# Patient Record
Sex: Female | Born: 1950 | Race: White | Hispanic: No | Marital: Single | Smoking: Never smoker
Health system: Southern US, Community
[De-identification: ages and names within clinical notes are randomized; demographics above are authoritative.]

## PROBLEM LIST (undated history)

## (undated) DIAGNOSIS — T7840XA Allergy, unspecified, initial encounter: Secondary | ICD-10-CM

## (undated) DIAGNOSIS — E559 Vitamin D deficiency, unspecified: Secondary | ICD-10-CM

## (undated) DIAGNOSIS — E785 Hyperlipidemia, unspecified: Secondary | ICD-10-CM

## (undated) DIAGNOSIS — C50919 Malignant neoplasm of unspecified site of unspecified female breast: Secondary | ICD-10-CM

## (undated) DIAGNOSIS — I10 Essential (primary) hypertension: Secondary | ICD-10-CM

## (undated) HISTORY — DX: Essential (primary) hypertension: I10

## (undated) HISTORY — DX: Vitamin D deficiency, unspecified: E55.9

## (undated) HISTORY — PX: TONSILLECTOMY: SUR1361

## (undated) HISTORY — DX: Malignant neoplasm of unspecified site of unspecified female breast: C50.919

## (undated) HISTORY — DX: Hyperlipidemia, unspecified: E78.5

## (undated) HISTORY — DX: Allergy, unspecified, initial encounter: T78.40XA

---

## 2001-04-23 HISTORY — PX: BIOPSY BREAST: PRO8

## 2002-01-05 ENCOUNTER — Other Ambulatory Visit: Admission: RE | Admit: 2002-01-05 | Discharge: 2002-01-05 | Payer: Self-pay | Admitting: Radiology

## 2004-03-14 ENCOUNTER — Other Ambulatory Visit: Admission: RE | Admit: 2004-03-14 | Discharge: 2004-03-14 | Payer: Self-pay | Admitting: Internal Medicine

## 2005-03-20 ENCOUNTER — Other Ambulatory Visit: Admission: RE | Admit: 2005-03-20 | Discharge: 2005-03-20 | Payer: Self-pay | Admitting: Internal Medicine

## 2006-01-10 DIAGNOSIS — C50919 Malignant neoplasm of unspecified site of unspecified female breast: Secondary | ICD-10-CM

## 2006-01-10 HISTORY — DX: Malignant neoplasm of unspecified site of unspecified female breast: C50.919

## 2006-01-15 ENCOUNTER — Ambulatory Visit: Payer: Self-pay | Admitting: Oncology

## 2006-01-17 ENCOUNTER — Ambulatory Visit (HOSPITAL_COMMUNITY): Admission: RE | Admit: 2006-01-17 | Discharge: 2006-01-17 | Payer: Self-pay | Admitting: Oncology

## 2006-01-17 ENCOUNTER — Encounter (INDEPENDENT_AMBULATORY_CARE_PROVIDER_SITE_OTHER): Payer: Self-pay | Admitting: *Deleted

## 2006-01-20 ENCOUNTER — Encounter: Admission: RE | Admit: 2006-01-20 | Discharge: 2006-01-20 | Payer: Self-pay | Admitting: Radiology

## 2006-01-22 ENCOUNTER — Ambulatory Visit (HOSPITAL_COMMUNITY): Admission: RE | Admit: 2006-01-22 | Discharge: 2006-01-22 | Payer: Self-pay | Admitting: Oncology

## 2006-02-01 ENCOUNTER — Ambulatory Visit (HOSPITAL_COMMUNITY): Admission: RE | Admit: 2006-02-01 | Discharge: 2006-02-01 | Payer: Self-pay | Admitting: Surgery

## 2006-02-27 ENCOUNTER — Ambulatory Visit: Payer: Self-pay | Admitting: Oncology

## 2006-02-27 LAB — COMPREHENSIVE METABOLIC PANEL
Albumin: 4.4 g/dL (ref 3.5–5.2)
BUN: 14 mg/dL (ref 6–23)
CO2: 26 mEq/L (ref 19–32)
Glucose, Bld: 118 mg/dL — ABNORMAL HIGH (ref 70–99)
Potassium: 4.1 mEq/L (ref 3.5–5.3)
Sodium: 140 mEq/L (ref 135–145)
Total Protein: 7.2 g/dL (ref 6.0–8.3)

## 2006-02-27 LAB — CBC WITH DIFFERENTIAL/PLATELET
Basophils Absolute: 0 10*3/uL (ref 0.0–0.1)
Eosinophils Absolute: 0 10*3/uL (ref 0.0–0.5)
HGB: 12.2 g/dL (ref 11.6–15.9)
LYMPH%: 9.6 % — ABNORMAL LOW (ref 14.0–48.0)
MCV: 91.1 fL (ref 81.0–101.0)
MONO#: 0.1 10*3/uL (ref 0.1–0.9)
MONO%: 1.5 % (ref 0.0–13.0)
NEUT#: 6.7 10*3/uL — ABNORMAL HIGH (ref 1.5–6.5)
Platelets: 471 10*3/uL — ABNORMAL HIGH (ref 145–400)
RBC: 3.83 10*6/uL (ref 3.70–5.32)
WBC: 7.5 10*3/uL (ref 3.9–10.0)

## 2006-03-07 LAB — CBC WITH DIFFERENTIAL/PLATELET
Basophils Absolute: 0.1 10*3/uL (ref 0.0–0.1)
Eosinophils Absolute: 0 10*3/uL (ref 0.0–0.5)
HGB: 11.9 g/dL (ref 11.6–15.9)
MONO#: 2.3 10*3/uL — ABNORMAL HIGH (ref 0.1–0.9)
NEUT#: 26 10*3/uL — ABNORMAL HIGH (ref 1.5–6.5)
RBC: 3.75 10*6/uL (ref 3.70–5.32)
RDW: 14.5 % (ref 11.3–14.5)
WBC: 32.6 10*3/uL — ABNORMAL HIGH (ref 3.9–10.0)
lymph#: 4.2 10*3/uL — ABNORMAL HIGH (ref 0.9–3.3)

## 2006-03-20 LAB — CBC WITH DIFFERENTIAL/PLATELET
Basophils Absolute: 0.1 10*3/uL (ref 0.0–0.1)
Eosinophils Absolute: 0 10*3/uL (ref 0.0–0.5)
HCT: 32.9 % — ABNORMAL LOW (ref 34.8–46.6)
HGB: 11.4 g/dL — ABNORMAL LOW (ref 11.6–15.9)
LYMPH%: 21.1 % (ref 14.0–48.0)
MONO#: 0.6 10*3/uL (ref 0.1–0.9)
NEUT#: 4.1 10*3/uL (ref 1.5–6.5)
NEUT%: 67.4 % (ref 39.6–76.8)
Platelets: 303 10*3/uL (ref 145–400)
RBC: 3.64 10*6/uL — ABNORMAL LOW (ref 3.70–5.32)
WBC: 6.1 10*3/uL (ref 3.9–10.0)

## 2006-03-20 LAB — COMPREHENSIVE METABOLIC PANEL
Albumin: 4 g/dL (ref 3.5–5.2)
CO2: 27 mEq/L (ref 19–32)
Glucose, Bld: 104 mg/dL — ABNORMAL HIGH (ref 70–99)
Sodium: 141 mEq/L (ref 135–145)
Total Bilirubin: 0.6 mg/dL (ref 0.3–1.2)
Total Protein: 6.7 g/dL (ref 6.0–8.3)

## 2006-03-20 LAB — RESEARCH LABS

## 2006-03-27 LAB — CBC WITH DIFFERENTIAL/PLATELET
BASO%: 0.7 % (ref 0.0–2.0)
Basophils Absolute: 0.1 10*3/uL (ref 0.0–0.1)
EOS%: 0.2 % (ref 0.0–7.0)
HCT: 33.7 % — ABNORMAL LOW (ref 34.8–46.6)
HGB: 11.6 g/dL (ref 11.6–15.9)
MCH: 31.9 pg (ref 26.0–34.0)
MCHC: 34.5 g/dL (ref 32.0–36.0)
MONO#: 0.2 10*3/uL (ref 0.1–0.9)
RDW: 15.2 % — ABNORMAL HIGH (ref 11.3–14.5)
WBC: 10.4 10*3/uL — ABNORMAL HIGH (ref 3.9–10.0)
lymph#: 2.7 10*3/uL (ref 0.9–3.3)

## 2006-04-10 ENCOUNTER — Ambulatory Visit: Payer: Self-pay | Admitting: Oncology

## 2006-04-10 LAB — CBC WITH DIFFERENTIAL/PLATELET
Basophils Absolute: 0 10*3/uL (ref 0.0–0.1)
Eosinophils Absolute: 0 10*3/uL (ref 0.0–0.5)
HCT: 31.8 % — ABNORMAL LOW (ref 34.8–46.6)
HGB: 10.6 g/dL — ABNORMAL LOW (ref 11.6–15.9)
NEUT#: 4.8 10*3/uL (ref 1.5–6.5)
NEUT%: 81.8 % — ABNORMAL HIGH (ref 39.6–76.8)
RDW: 16.6 % — ABNORMAL HIGH (ref 11.3–14.5)
lymph#: 0.8 10*3/uL — ABNORMAL LOW (ref 0.9–3.3)

## 2006-04-10 LAB — COMPREHENSIVE METABOLIC PANEL
Albumin: 4 g/dL (ref 3.5–5.2)
BUN: 17 mg/dL (ref 6–23)
CO2: 27 mEq/L (ref 19–32)
Calcium: 9.1 mg/dL (ref 8.4–10.5)
Chloride: 104 mEq/L (ref 96–112)
Glucose, Bld: 106 mg/dL — ABNORMAL HIGH (ref 70–99)
Potassium: 3.9 mEq/L (ref 3.5–5.3)

## 2006-04-29 ENCOUNTER — Encounter: Admission: RE | Admit: 2006-04-29 | Discharge: 2006-04-29 | Payer: Self-pay | Admitting: Oncology

## 2006-05-01 LAB — CBC WITH DIFFERENTIAL/PLATELET
Eosinophils Absolute: 0 10*3/uL (ref 0.0–0.5)
MONO#: 0.5 10*3/uL (ref 0.1–0.9)
NEUT#: 3.7 10*3/uL (ref 1.5–6.5)
Platelets: 341 10*3/uL (ref 145–400)
RBC: 3.57 10*6/uL — ABNORMAL LOW (ref 3.70–5.32)
RDW: 18 % — ABNORMAL HIGH (ref 11.3–14.5)
WBC: 5.7 10*3/uL (ref 3.9–10.0)
lymph#: 1.3 10*3/uL (ref 0.9–3.3)

## 2006-05-01 LAB — COMPREHENSIVE METABOLIC PANEL
Albumin: 4.1 g/dL (ref 3.5–5.2)
CO2: 26 mEq/L (ref 19–32)
Chloride: 104 mEq/L (ref 96–112)
Glucose, Bld: 102 mg/dL — ABNORMAL HIGH (ref 70–99)
Potassium: 4 mEq/L (ref 3.5–5.3)
Sodium: 139 mEq/L (ref 135–145)
Total Protein: 6.5 g/dL (ref 6.0–8.3)

## 2006-05-08 LAB — CBC WITH DIFFERENTIAL/PLATELET
BASO%: 0.4 % (ref 0.0–2.0)
Eosinophils Absolute: 0 10*3/uL (ref 0.0–0.5)
MCHC: 34.2 g/dL (ref 32.0–36.0)
MONO#: 0.1 10*3/uL (ref 0.1–0.9)
MONO%: 1.7 % (ref 0.0–13.0)
NEUT#: 2.4 10*3/uL (ref 1.5–6.5)
RBC: 3.28 10*6/uL — ABNORMAL LOW (ref 3.70–5.32)
RDW: 16.6 % — ABNORMAL HIGH (ref 11.3–14.5)
WBC: 3.2 10*3/uL — ABNORMAL LOW (ref 3.9–10.0)

## 2006-05-20 ENCOUNTER — Ambulatory Visit: Payer: Self-pay | Admitting: Oncology

## 2006-05-22 LAB — COMPREHENSIVE METABOLIC PANEL
ALT: <8 U/L (ref 0–35)
Albumin: 4.2 g/dL (ref 3.5–5.2)
CO2: 27 mEq/L (ref 19–32)
Calcium: 9.2 mg/dL (ref 8.4–10.5)
Chloride: 104 mEq/L (ref 96–112)
Glucose, Bld: 118 mg/dL — ABNORMAL HIGH (ref 70–99)
Potassium: 4.1 mEq/L (ref 3.5–5.3)
Sodium: 140 mEq/L (ref 135–145)
Total Bilirubin: 0.6 mg/dL (ref 0.3–1.2)
Total Protein: 6.5 g/dL (ref 6.0–8.3)

## 2006-05-22 LAB — CBC WITH DIFFERENTIAL/PLATELET
BASO%: 0.7 % (ref 0.0–2.0)
Eosinophils Absolute: 0 10*3/uL (ref 0.0–0.5)
HCT: 32.3 % — ABNORMAL LOW (ref 34.8–46.6)
LYMPH%: 18.4 % (ref 14.0–48.0)
MONO#: 0.6 10*3/uL (ref 0.1–0.9)
NEUT#: 3.4 10*3/uL (ref 1.5–6.5)
Platelets: 389 10*3/uL (ref 145–400)
RBC: 3.39 10*6/uL — ABNORMAL LOW (ref 3.70–5.32)
WBC: 5 10*3/uL (ref 3.9–10.0)
lymph#: 0.9 10*3/uL (ref 0.9–3.3)

## 2006-06-12 LAB — COMPREHENSIVE METABOLIC PANEL
ALT: 8 U/L (ref 0–35)
AST: 15 U/L (ref 0–37)
BUN: 16 mg/dL (ref 6–23)
Creatinine, Ser: 0.69 mg/dL (ref 0.40–1.20)
Total Bilirubin: 0.4 mg/dL (ref 0.3–1.2)

## 2006-06-12 LAB — CBC WITH DIFFERENTIAL/PLATELET
BASO%: 0.6 % (ref 0.0–2.0)
Basophils Absolute: 0 10*3/uL (ref 0.0–0.1)
EOS%: 0 % (ref 0.0–7.0)
HCT: 31.2 % — ABNORMAL LOW (ref 34.8–46.6)
LYMPH%: 15.6 % (ref 14.0–48.0)
MCH: 33.1 pg (ref 26.0–34.0)
MCHC: 35.2 g/dL (ref 32.0–36.0)
MCV: 94 fL (ref 81.0–101.0)
NEUT%: 72.9 % (ref 39.6–76.8)
Platelets: 347 10*3/uL (ref 145–400)

## 2006-06-19 LAB — CBC WITH DIFFERENTIAL/PLATELET
BASO%: 0.1 % (ref 0.0–2.0)
Basophils Absolute: 0 10*3/uL (ref 0.0–0.1)
HCT: 29.8 % — ABNORMAL LOW (ref 34.8–46.6)
HGB: 10.4 g/dL — ABNORMAL LOW (ref 11.6–15.9)
LYMPH%: 12.7 % — ABNORMAL LOW (ref 14.0–48.0)
MCH: 32.8 pg (ref 26.0–34.0)
MCHC: 35 g/dL (ref 32.0–36.0)
MONO#: 0.1 10*3/uL (ref 0.1–0.9)
NEUT%: 85.7 % — ABNORMAL HIGH (ref 39.6–76.8)
Platelets: 176 10*3/uL (ref 145–400)
WBC: 3.9 10*3/uL (ref 3.9–10.0)
lymph#: 0.5 10*3/uL — ABNORMAL LOW (ref 0.9–3.3)

## 2006-07-02 ENCOUNTER — Ambulatory Visit: Payer: Self-pay | Admitting: Oncology

## 2006-07-03 LAB — COMPREHENSIVE METABOLIC PANEL
ALT: <8 U/L (ref 0–35)
AST: 14 U/L (ref 0–37)
Alkaline Phosphatase: 102 U/L (ref 39–117)
BUN: 18 mg/dL (ref 6–23)
Calcium: 9.4 mg/dL (ref 8.4–10.5)
Creatinine, Ser: 0.72 mg/dL (ref 0.40–1.20)
Total Bilirubin: 0.4 mg/dL (ref 0.3–1.2)

## 2006-07-03 LAB — CBC WITH DIFFERENTIAL/PLATELET
BASO%: 1.7 % (ref 0.0–2.0)
Basophils Absolute: 0.1 10*3/uL (ref 0.0–0.1)
EOS%: 0 % (ref 0.0–7.0)
HCT: 31.9 % — ABNORMAL LOW (ref 34.8–46.6)
HGB: 11.2 g/dL — ABNORMAL LOW (ref 11.6–15.9)
LYMPH%: 17.1 % (ref 14.0–48.0)
MCH: 33.1 pg (ref 26.0–34.0)
MCHC: 35 g/dL (ref 32.0–36.0)
MCV: 94.5 fL (ref 81.0–101.0)
MONO%: 13.5 % — ABNORMAL HIGH (ref 0.0–13.0)
NEUT%: 67.7 % (ref 39.6–76.8)
lymph#: 1 10*3/uL (ref 0.9–3.3)

## 2006-07-25 ENCOUNTER — Ambulatory Visit: Payer: Self-pay

## 2006-07-25 ENCOUNTER — Encounter: Payer: Self-pay | Admitting: Internal Medicine

## 2006-07-29 LAB — COMPREHENSIVE METABOLIC PANEL
ALT: 8 U/L (ref 0–35)
AST: 21 U/L (ref 0–37)
Albumin: 4.3 g/dL (ref 3.5–5.2)
Alkaline Phosphatase: 86 U/L (ref 39–117)
BUN: 16 mg/dL (ref 6–23)
CO2: 24 mEq/L (ref 19–32)
Calcium: 9.3 mg/dL (ref 8.4–10.5)
Chloride: 106 mEq/L (ref 96–112)
Creatinine, Ser: 0.69 mg/dL (ref 0.40–1.20)
Glucose, Bld: 102 mg/dL — ABNORMAL HIGH (ref 70–99)
Potassium: 3.8 mEq/L (ref 3.5–5.3)
Sodium: 142 mEq/L (ref 135–145)
Total Bilirubin: 0.5 mg/dL (ref 0.3–1.2)
Total Protein: 6.6 g/dL (ref 6.0–8.3)

## 2006-07-29 LAB — CBC WITH DIFFERENTIAL/PLATELET
BASO%: 0.9 % (ref 0.0–2.0)
Eosinophils Absolute: 0 10*3/uL (ref 0.0–0.5)
MCHC: 35.3 g/dL (ref 32.0–36.0)
MONO#: 0.5 10*3/uL (ref 0.1–0.9)
MONO%: 14.9 % — ABNORMAL HIGH (ref 0.0–13.0)
NEUT#: 2 10*3/uL (ref 1.5–6.5)
RBC: 3.24 10*6/uL — ABNORMAL LOW (ref 3.70–5.32)
RDW: 17.2 % — ABNORMAL HIGH (ref 11.3–14.5)
WBC: 3.2 10*3/uL — ABNORMAL LOW (ref 3.9–10.0)

## 2006-07-29 LAB — RESEARCH LABS

## 2006-08-06 ENCOUNTER — Ambulatory Visit (HOSPITAL_COMMUNITY): Admission: RE | Admit: 2006-08-06 | Discharge: 2006-08-06 | Payer: Self-pay | Admitting: Oncology

## 2006-08-12 ENCOUNTER — Encounter (INDEPENDENT_AMBULATORY_CARE_PROVIDER_SITE_OTHER): Payer: Self-pay | Admitting: *Deleted

## 2006-08-12 ENCOUNTER — Ambulatory Visit (HOSPITAL_BASED_OUTPATIENT_CLINIC_OR_DEPARTMENT_OTHER): Admission: RE | Admit: 2006-08-12 | Discharge: 2006-08-13 | Payer: Self-pay | Admitting: Surgery

## 2006-08-12 HISTORY — PX: BREAST LUMPECTOMY W/ NEEDLE LOCALIZATION: SHX1266

## 2006-08-27 ENCOUNTER — Ambulatory Visit: Admission: RE | Admit: 2006-08-27 | Discharge: 2006-10-29 | Payer: Self-pay | Admitting: Radiation Oncology

## 2006-08-30 ENCOUNTER — Ambulatory Visit: Payer: Self-pay | Admitting: Oncology

## 2006-09-02 LAB — CBC WITH DIFFERENTIAL/PLATELET
BASO%: 0.5 % (ref 0.0–2.0)
EOS%: 1.1 % (ref 0.0–7.0)
HCT: 33.2 % — ABNORMAL LOW (ref 34.8–46.6)
LYMPH%: 20.7 % (ref 14.0–48.0)
MCH: 33.3 pg (ref 26.0–34.0)
MCHC: 36.1 g/dL — ABNORMAL HIGH (ref 32.0–36.0)
MCV: 92.4 fL (ref 81.0–101.0)
MONO#: 0.5 10*3/uL (ref 0.1–0.9)
MONO%: 8.6 % (ref 0.0–13.0)
NEUT%: 69.1 % (ref 39.6–76.8)
Platelets: 280 10*3/uL (ref 145–400)
RBC: 3.59 10*6/uL — ABNORMAL LOW (ref 3.70–5.32)
WBC: 6 10*3/uL (ref 3.9–10.0)

## 2006-09-02 LAB — COMPREHENSIVE METABOLIC PANEL
ALT: <8 U/L (ref 0–35)
AST: 15 U/L (ref 0–37)
Alkaline Phosphatase: 104 U/L (ref 39–117)
CO2: 28 mEq/L (ref 19–32)
Creatinine, Ser: 0.8 mg/dL (ref 0.40–1.20)
Sodium: 140 mEq/L (ref 135–145)
Total Bilirubin: 0.5 mg/dL (ref 0.3–1.2)
Total Protein: 6.7 g/dL (ref 6.0–8.3)

## 2006-09-02 LAB — LACTATE DEHYDROGENASE: LDH: 170 U/L (ref 94–250)

## 2006-10-15 ENCOUNTER — Other Ambulatory Visit: Admission: RE | Admit: 2006-10-15 | Discharge: 2006-10-15 | Payer: Self-pay | Admitting: Family Medicine

## 2006-10-24 ENCOUNTER — Ambulatory Visit: Payer: Self-pay | Admitting: Oncology

## 2006-10-28 LAB — CBC WITH DIFFERENTIAL/PLATELET
BASO%: 0.6 % (ref 0.0–2.0)
Basophils Absolute: 0 10*3/uL (ref 0.0–0.1)
EOS%: 2.3 % (ref 0.0–7.0)
HGB: 12.9 g/dL (ref 11.6–15.9)
MCH: 31.6 pg (ref 26.0–34.0)
MCHC: 35.1 g/dL (ref 32.0–36.0)
MCV: 90 fL (ref 81.0–101.0)
MONO%: 11.9 % (ref 0.0–13.0)
RDW: 13.3 % (ref 11.3–14.5)

## 2006-10-28 LAB — COMPREHENSIVE METABOLIC PANEL
AST: 20 U/L (ref 0–37)
Albumin: 4.2 g/dL (ref 3.5–5.2)
Alkaline Phosphatase: 87 U/L (ref 39–117)
BUN: 16 mg/dL (ref 6–23)
Creatinine, Ser: 0.66 mg/dL (ref 0.40–1.20)
Potassium: 4.4 mEq/L (ref 3.5–5.3)

## 2006-10-31 ENCOUNTER — Encounter: Admission: RE | Admit: 2006-10-31 | Discharge: 2006-11-25 | Payer: Self-pay | Admitting: Radiation Oncology

## 2006-11-25 ENCOUNTER — Ambulatory Visit (HOSPITAL_BASED_OUTPATIENT_CLINIC_OR_DEPARTMENT_OTHER): Admission: RE | Admit: 2006-11-25 | Discharge: 2006-11-25 | Payer: Self-pay | Admitting: Surgery

## 2007-02-13 ENCOUNTER — Ambulatory Visit: Payer: Self-pay | Admitting: Oncology

## 2007-05-14 ENCOUNTER — Ambulatory Visit: Payer: Self-pay | Admitting: Oncology

## 2007-05-16 LAB — COMPREHENSIVE METABOLIC PANEL
AST: 17 U/L (ref 0–37)
Albumin: 4.2 g/dL (ref 3.5–5.2)
BUN: 18 mg/dL (ref 6–23)
CO2: 26 mEq/L (ref 19–32)
Calcium: 9.2 mg/dL (ref 8.4–10.5)
Chloride: 104 mEq/L (ref 96–112)
Glucose, Bld: 113 mg/dL — ABNORMAL HIGH (ref 70–99)
Potassium: 3.8 mEq/L (ref 3.5–5.3)

## 2007-05-16 LAB — CBC WITH DIFFERENTIAL/PLATELET
Eosinophils Absolute: 0.1 10*3/uL (ref 0.0–0.5)
HCT: 37.4 % (ref 34.8–46.6)
LYMPH%: 30.5 % (ref 14.0–48.0)
MONO#: 0.4 10*3/uL (ref 0.1–0.9)
NEUT#: 2.7 10*3/uL (ref 1.5–6.5)
NEUT%: 57.6 % (ref 39.6–76.8)
Platelets: 242 10*3/uL (ref 145–400)
WBC: 4.7 10*3/uL (ref 3.9–10.0)

## 2007-05-16 LAB — LACTATE DEHYDROGENASE: LDH: 155 U/L (ref 94–250)

## 2007-06-25 ENCOUNTER — Ambulatory Visit: Payer: Self-pay | Admitting: Oncology

## 2007-07-23 IMAGING — CR DG CHEST 2V
2 series · 2 of 2 positions shown · non-contrast
Comparison: Comparison is made with CT of 01/22/06.

CLINICAL DATA: Right breast cancer.  Pre-admit.
 CHEST - 2 VIEW:

[view not recorded (1 of 2)]
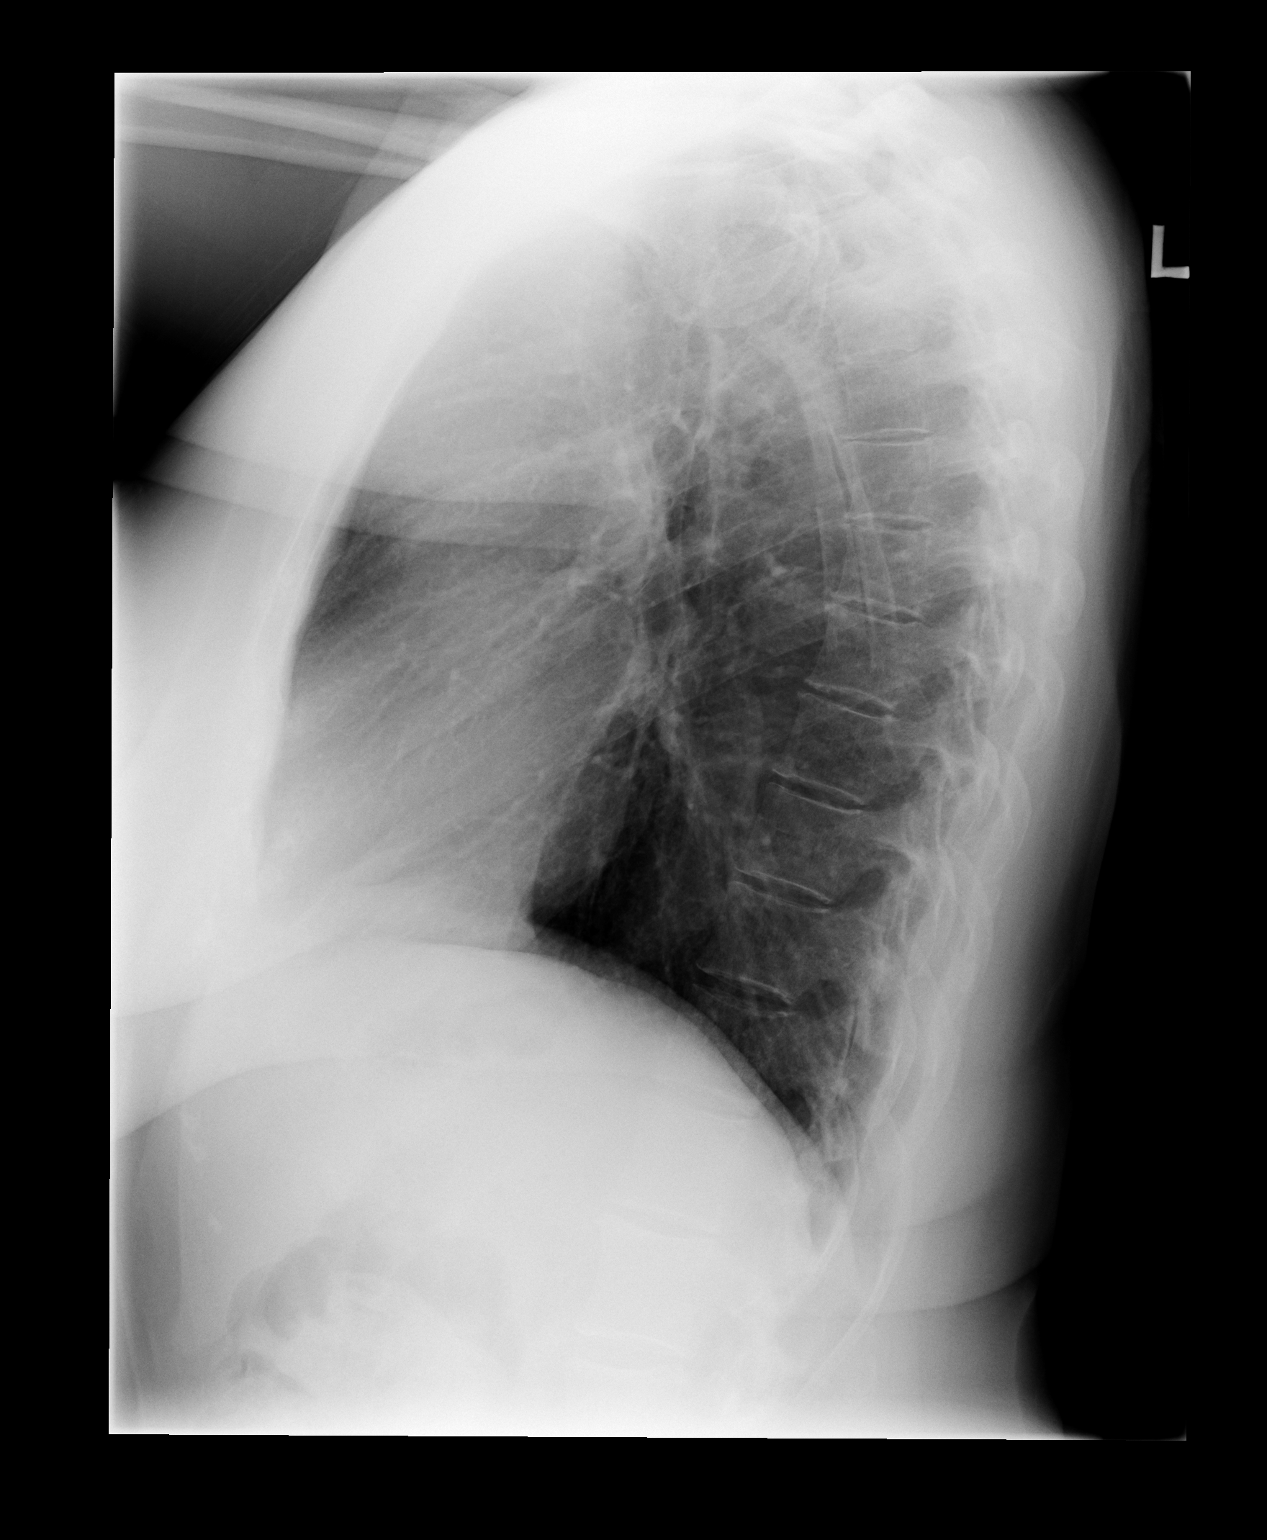

[view not recorded (2 of 2)]
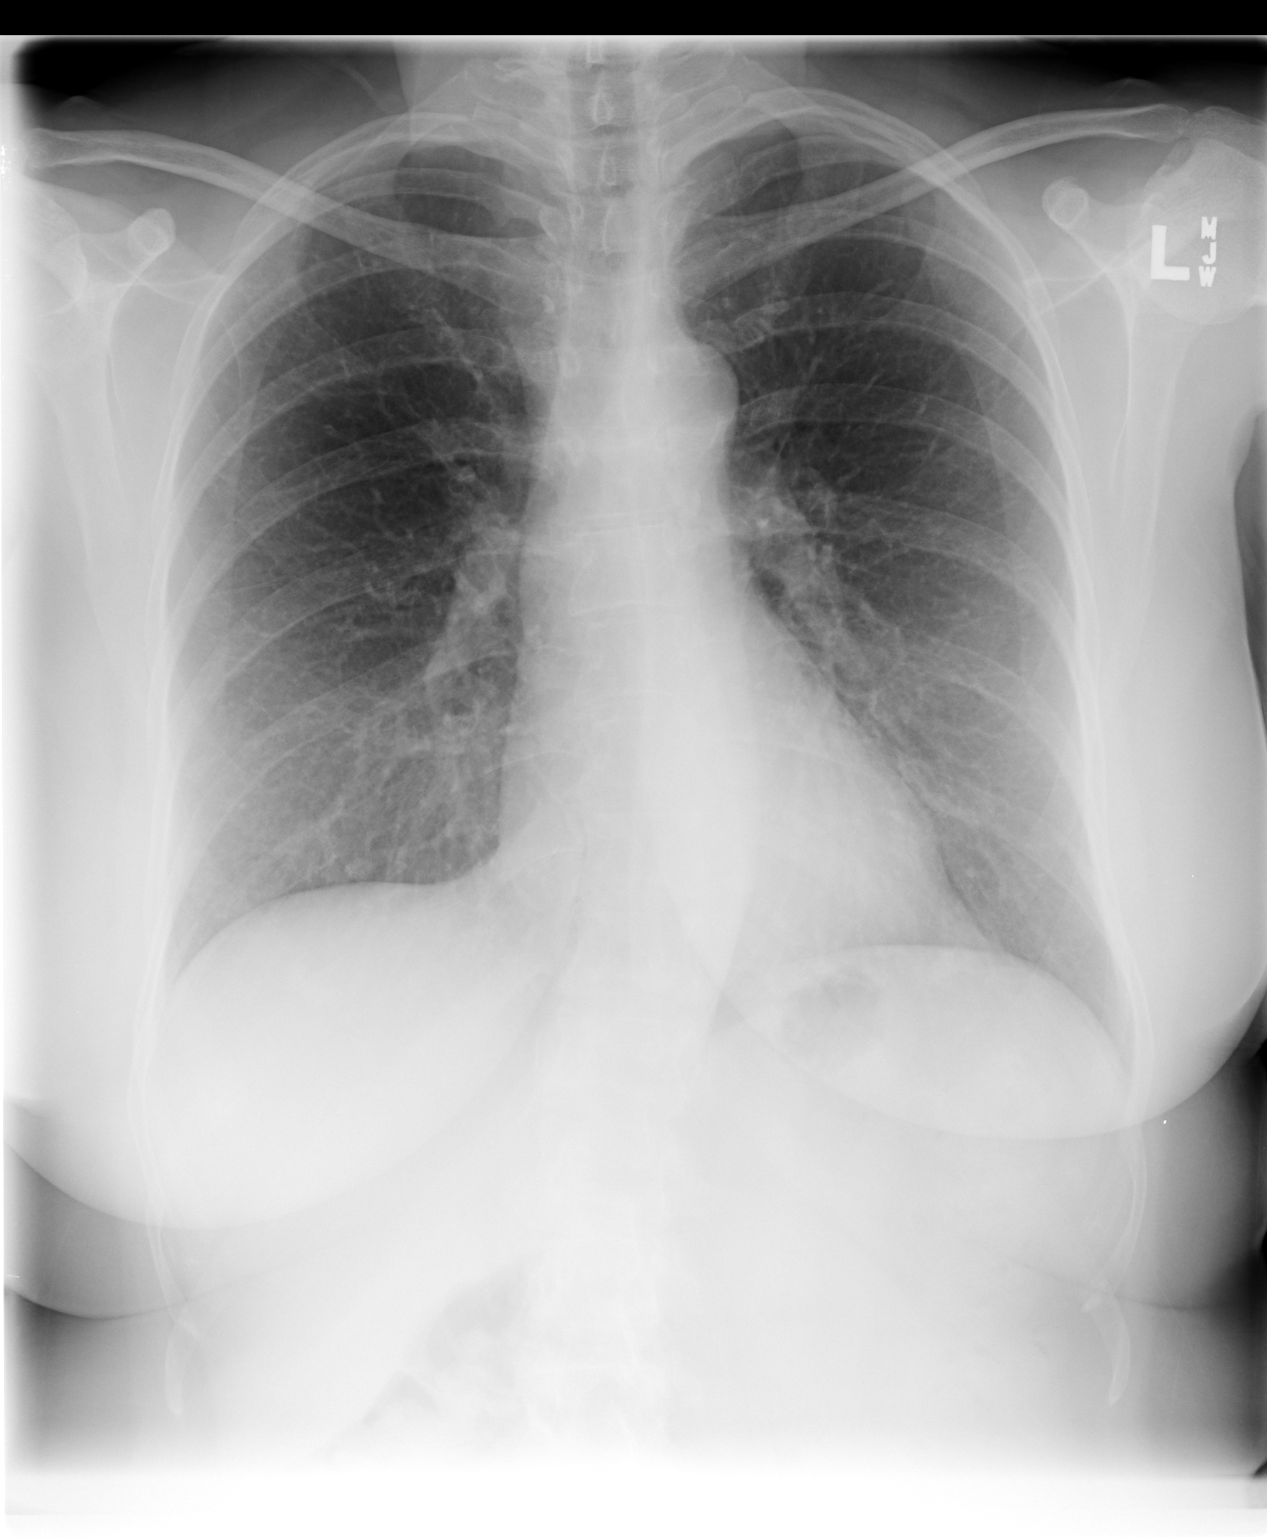

[2 of 2 positions shown; findings below may reference images not displayed]

FINDINGS: Midline trachea.  Normal heart size.  The costophrenic angles are sharp.  The lungs are clear.
IMPRESSION: No acute cardiopulmonary disease.

## 2007-08-06 ENCOUNTER — Ambulatory Visit: Payer: Self-pay | Admitting: Oncology

## 2007-08-11 LAB — CBC WITH DIFFERENTIAL/PLATELET
BASO%: 0.7 % (ref 0.0–2.0)
Basophils Absolute: 0 10*3/uL (ref 0.0–0.1)
HCT: 36.7 % (ref 34.8–46.6)
HGB: 13 g/dL (ref 11.6–15.9)
MCHC: 35.4 g/dL (ref 32.0–36.0)
MONO#: 0.5 10*3/uL (ref 0.1–0.9)
NEUT%: 61.5 % (ref 39.6–76.8)
RDW: 13 % (ref 11.3–14.5)
WBC: 5.7 10*3/uL (ref 3.9–10.0)
lymph#: 1.7 10*3/uL (ref 0.9–3.3)

## 2007-08-11 LAB — COMPREHENSIVE METABOLIC PANEL
Alkaline Phosphatase: 78 U/L (ref 39–117)
BUN: 18 mg/dL (ref 6–23)
CO2: 27 mEq/L (ref 19–32)
Glucose, Bld: 123 mg/dL — ABNORMAL HIGH (ref 70–99)
Sodium: 139 mEq/L (ref 135–145)
Total Bilirubin: 0.9 mg/dL (ref 0.3–1.2)
Total Protein: 7 g/dL (ref 6.0–8.3)

## 2007-08-11 LAB — LACTATE DEHYDROGENASE: LDH: 172 U/L (ref 94–250)

## 2007-08-11 LAB — CANCER ANTIGEN 27.29: CA 27.29: 12 U/mL (ref 0–39)

## 2007-10-16 ENCOUNTER — Other Ambulatory Visit: Admission: RE | Admit: 2007-10-16 | Discharge: 2007-10-16 | Payer: Self-pay | Admitting: Family Medicine

## 2008-02-10 ENCOUNTER — Ambulatory Visit: Payer: Self-pay | Admitting: Oncology

## 2008-02-12 LAB — CBC WITH DIFFERENTIAL/PLATELET
Eosinophils Absolute: 0.1 10*3/uL (ref 0.0–0.5)
HCT: 37.8 % (ref 34.8–46.6)
LYMPH%: 33.2 % (ref 14.0–48.0)
MONO#: 0.4 10*3/uL (ref 0.1–0.9)
NEUT#: 3.1 10*3/uL (ref 1.5–6.5)
Platelets: 238 10*3/uL (ref 145–400)
RBC: 4.1 10*6/uL (ref 3.70–5.32)
WBC: 5.5 10*3/uL (ref 3.9–10.0)

## 2008-02-13 LAB — COMPREHENSIVE METABOLIC PANEL
AST: 17 U/L (ref 0–37)
Alkaline Phosphatase: 84 U/L (ref 39–117)
BUN: 15 mg/dL (ref 6–23)
Creatinine, Ser: 0.93 mg/dL (ref 0.40–1.20)

## 2008-02-13 LAB — VITAMIN D 25 HYDROXY (VIT D DEFICIENCY, FRACTURES): Vit D, 25-Hydroxy: 35 ng/mL (ref 30–89)

## 2008-08-25 ENCOUNTER — Ambulatory Visit: Payer: Self-pay | Admitting: Oncology

## 2008-08-27 LAB — CBC WITH DIFFERENTIAL/PLATELET
BASO%: 0.8 % (ref 0.0–2.0)
EOS%: 1.9 % (ref 0.0–7.0)
HCT: 37.9 % (ref 34.8–46.6)
LYMPH%: 36.3 % (ref 14.0–49.7)
MCH: 31.7 pg (ref 25.1–34.0)
MCHC: 34.4 g/dL (ref 31.5–36.0)
MONO%: 6.5 % (ref 0.0–14.0)
NEUT%: 54.5 % (ref 38.4–76.8)
Platelets: 222 10*3/uL (ref 145–400)

## 2008-08-30 LAB — COMPREHENSIVE METABOLIC PANEL
AST: 19 U/L (ref 0–37)
Alkaline Phosphatase: 92 U/L (ref 39–117)
BUN: 15 mg/dL (ref 6–23)
Calcium: 9.6 mg/dL (ref 8.4–10.5)
Creatinine, Ser: 0.8 mg/dL (ref 0.40–1.20)
Total Bilirubin: 0.7 mg/dL (ref 0.3–1.2)

## 2008-08-30 LAB — VITAMIN D 25 HYDROXY (VIT D DEFICIENCY, FRACTURES): Vit D, 25-Hydroxy: 48 ng/mL (ref 30–89)

## 2008-11-11 ENCOUNTER — Other Ambulatory Visit: Admission: RE | Admit: 2008-11-11 | Discharge: 2008-11-11 | Payer: Self-pay | Admitting: Family Medicine

## 2009-02-04 ENCOUNTER — Ambulatory Visit: Payer: Self-pay | Admitting: Oncology

## 2009-02-08 LAB — CBC WITH DIFFERENTIAL/PLATELET
BASO%: 0.6 % (ref 0.0–2.0)
Eosinophils Absolute: 0.1 10*3/uL (ref 0.0–0.5)
HCT: 37.9 % (ref 34.8–46.6)
MCHC: 35.4 g/dL (ref 31.5–36.0)
MONO#: 0.4 10*3/uL (ref 0.1–0.9)
NEUT#: 3 10*3/uL (ref 1.5–6.5)
NEUT%: 53.5 % (ref 38.4–76.8)
WBC: 5.6 10*3/uL (ref 3.9–10.3)
lymph#: 2.1 10*3/uL (ref 0.9–3.3)

## 2009-02-09 LAB — COMPREHENSIVE METABOLIC PANEL
ALT: <8 U/L (ref 0–35)
CO2: 27 mEq/L (ref 19–32)
Calcium: 9.6 mg/dL (ref 8.4–10.5)
Chloride: 103 mEq/L (ref 96–112)
Creatinine, Ser: 0.82 mg/dL (ref 0.40–1.20)
Sodium: 139 mEq/L (ref 135–145)
Total Protein: 6.9 g/dL (ref 6.0–8.3)

## 2009-02-09 LAB — LACTATE DEHYDROGENASE: LDH: 186 U/L (ref 94–250)

## 2009-08-08 ENCOUNTER — Ambulatory Visit: Payer: Self-pay | Admitting: Oncology

## 2009-08-10 LAB — COMPREHENSIVE METABOLIC PANEL WITH GFR
ALT: 10 U/L (ref 0–35)
AST: 24 U/L (ref 0–37)
Albumin: 4 g/dL (ref 3.5–5.2)
Alkaline Phosphatase: 97 U/L (ref 39–117)
BUN: 17 mg/dL (ref 6–23)
CO2: 31 meq/L (ref 19–32)
Calcium: 9.7 mg/dL (ref 8.4–10.5)
Chloride: 101 meq/L (ref 96–112)
Creatinine, Ser: 0.69 mg/dL (ref 0.40–1.20)
Glucose, Bld: 122 mg/dL — ABNORMAL HIGH (ref 70–99)
Potassium: 3.8 meq/L (ref 3.5–5.3)
Sodium: 139 meq/L (ref 135–145)
Total Bilirubin: 0.8 mg/dL (ref 0.3–1.2)
Total Protein: 7.1 g/dL (ref 6.0–8.3)

## 2009-08-10 LAB — CBC WITH DIFFERENTIAL/PLATELET
Basophils Absolute: 0 10*3/uL (ref 0.0–0.1)
Eosinophils Absolute: 0.2 10*3/uL (ref 0.0–0.5)
HGB: 13.6 g/dL (ref 11.6–15.9)
MONO#: 0.5 10*3/uL (ref 0.1–0.9)
NEUT#: 4.4 10*3/uL (ref 1.5–6.5)
RDW: 13.7 % (ref 11.2–14.5)
lymph#: 2.6 10*3/uL (ref 0.9–3.3)

## 2009-08-11 LAB — VITAMIN D 25 HYDROXY (VIT D DEFICIENCY, FRACTURES): Vit D, 25-Hydroxy: 64 ng/mL (ref 30–89)

## 2009-08-11 LAB — CANCER ANTIGEN 27.29: CA 27.29: 9 U/mL (ref 0–39)

## 2010-02-14 ENCOUNTER — Ambulatory Visit: Payer: Self-pay | Admitting: Oncology

## 2010-02-16 LAB — CBC WITH DIFFERENTIAL/PLATELET
Eosinophils Absolute: 0.1 10*3/uL (ref 0.0–0.5)
LYMPH%: 37.2 % (ref 14.0–49.7)
MCH: 32.7 pg (ref 25.1–34.0)
MCHC: 35.1 g/dL (ref 31.5–36.0)
MCV: 93.2 fL (ref 79.5–101.0)
MONO%: 8.4 % (ref 0.0–14.0)
NEUT#: 2.8 10*3/uL (ref 1.5–6.5)
Platelets: 248 10*3/uL (ref 145–400)
RBC: 3.98 10*6/uL (ref 3.70–5.45)

## 2010-02-17 LAB — CANCER ANTIGEN 27.29: CA 27.29: 15 U/mL (ref 0–39)

## 2010-02-17 LAB — VITAMIN D 25 HYDROXY (VIT D DEFICIENCY, FRACTURES): Vit D, 25-Hydroxy: 70 ng/mL (ref 30–89)

## 2010-02-17 LAB — COMPREHENSIVE METABOLIC PANEL
Alkaline Phosphatase: 96 U/L (ref 39–117)
BUN: 19 mg/dL (ref 6–23)
Glucose, Bld: 104 mg/dL — ABNORMAL HIGH (ref 70–99)
Sodium: 139 mEq/L (ref 135–145)
Total Bilirubin: 0.7 mg/dL (ref 0.3–1.2)
Total Protein: 6.9 g/dL (ref 6.0–8.3)

## 2010-08-17 ENCOUNTER — Encounter (HOSPITAL_BASED_OUTPATIENT_CLINIC_OR_DEPARTMENT_OTHER): Payer: BC Managed Care – PPO | Admitting: Oncology

## 2010-08-17 ENCOUNTER — Other Ambulatory Visit: Payer: Self-pay | Admitting: Oncology

## 2010-08-17 DIAGNOSIS — Z171 Estrogen receptor negative status [ER-]: Secondary | ICD-10-CM

## 2010-08-17 DIAGNOSIS — C50919 Malignant neoplasm of unspecified site of unspecified female breast: Secondary | ICD-10-CM

## 2010-08-17 DIAGNOSIS — Z853 Personal history of malignant neoplasm of breast: Secondary | ICD-10-CM

## 2010-08-17 LAB — CBC WITH DIFFERENTIAL/PLATELET
BASO%: 1.3 % (ref 0.0–2.0)
EOS%: 1.4 % (ref 0.0–7.0)
HCT: 37.3 % (ref 34.8–46.6)
MCH: 32 pg (ref 25.1–34.0)
MCHC: 34.8 g/dL (ref 31.5–36.0)
MCV: 92 fL (ref 79.5–101.0)
MONO%: 7.4 % (ref 0.0–14.0)
NEUT%: 59.5 % (ref 38.4–76.8)
RDW: 12.9 % (ref 11.2–14.5)
lymph#: 2.2 10*3/uL (ref 0.9–3.3)

## 2010-08-18 LAB — VITAMIN D 25 HYDROXY (VIT D DEFICIENCY, FRACTURES): Vit D, 25-Hydroxy: 73 ng/mL (ref 30–89)

## 2010-08-18 LAB — COMPREHENSIVE METABOLIC PANEL
ALT: 10 U/L (ref 0–35)
AST: 25 U/L (ref 0–37)
Alkaline Phosphatase: 83 U/L (ref 39–117)
Calcium: 9.4 mg/dL (ref 8.4–10.5)
Chloride: 102 mEq/L (ref 96–112)
Creatinine, Ser: 0.84 mg/dL (ref 0.40–1.20)

## 2010-08-24 ENCOUNTER — Encounter: Payer: BC Managed Care – PPO | Admitting: Oncology

## 2010-09-05 NOTE — Op Note (Signed)
NAME:  Kelly Collins, STAMMER NO.:  [id number]   MEDICAL RECORD NO.:  [id number]          PATIENT TYPE:  AMB   LOCATION:  [facility]                          FACILITY:  [facility]   PHYSICIAN:  Currie Paris, M.D.DATE OF BIRTH:  07/13/1950   DATE OF PROCEDURE:  11/25/2006  DATE OF DISCHARGE:                               OPERATIVE REPORT   PREOPERATIVE DIAGNOSIS:  Unneeded port-A-Cath.   POSTOPERATIVE DIAGNOSIS:  Unneeded port-A-Cath.   OPERATION:  Removal of port-A-Cath.   SURGEON:  Currie Paris, M.D.   ANESTHESIA:  Local.   CLINICAL HISTORY:  [name] is a 60 year old lady who has completed  all of her therapy for her breast cancer.  She wished to have her port  removed.   DESCRIPTION OF PROCEDURE:  The patient was seen in the minor procedure  room.  We confirmed the location of the port and that port removal was  the planned procedure.  The area over the port was prepped with some  alcohol, and local anesthesia obtained with injection of 1% Xylocaine  with epinephrine.  I waited about 10 minutes for the epinephrine to have  good effect.  We then came back, prepped the area with Betadine, and  draped it as a sterile field.  I opened the old scar.  I identified the  port-A-Cath reservoir and freed up the port-A-Cath tubing.  I placed a 3-  0 Vicryl suture around the tract of the tubing and then backed the  tubing out and tied the suture down.  There was no back bleeding.  The  port-A-Cath reservoir was freed up from its capsule and removed.  There  was no significant bleeding.   The incision was closed with 3-0 Vicryl, 4-0 Monocryl subcuticular, and  Dermabond.   The patient tolerated the procedure well, and there were no operative  complications.      Currie Paris, M.D.  Electronically Signed     CJS/MEDQ  D:  11/25/2006  T:  11/25/2006  Job:  [id number]

## 2010-11-22 ENCOUNTER — Other Ambulatory Visit: Payer: Self-pay | Admitting: Family Medicine

## 2010-11-22 DIAGNOSIS — E041 Nontoxic single thyroid nodule: Secondary | ICD-10-CM

## 2010-11-23 ENCOUNTER — Ambulatory Visit
Admission: RE | Admit: 2010-11-23 | Discharge: 2010-11-23 | Disposition: A | Discharge status: Home / Self Care | Payer: BC Managed Care – PPO | Source: Ambulatory Visit | Attending: Family Medicine | Admitting: Family Medicine

## 2010-11-23 DIAGNOSIS — E041 Nontoxic single thyroid nodule: Secondary | ICD-10-CM

## 2011-02-20 ENCOUNTER — Telehealth: Payer: Self-pay | Admitting: *Deleted

## 2011-02-21 ENCOUNTER — Other Ambulatory Visit: Payer: Self-pay | Admitting: Physician Assistant

## 2011-02-21 ENCOUNTER — Encounter (HOSPITAL_BASED_OUTPATIENT_CLINIC_OR_DEPARTMENT_OTHER): Payer: BC Managed Care – PPO | Admitting: Oncology

## 2011-02-21 DIAGNOSIS — C50419 Malignant neoplasm of upper-outer quadrant of unspecified female breast: Secondary | ICD-10-CM

## 2011-02-21 DIAGNOSIS — C50919 Malignant neoplasm of unspecified site of unspecified female breast: Secondary | ICD-10-CM

## 2011-02-21 LAB — CBC WITH DIFFERENTIAL/PLATELET
BASO%: 0.9 % (ref 0.0–2.0)
LYMPH%: 37.6 % (ref 14.0–49.7)
MCHC: 34.4 g/dL (ref 31.5–36.0)
MONO#: 0.4 10*3/uL (ref 0.1–0.9)
MONO%: 7.5 % (ref 0.0–14.0)
Platelets: 234 10*3/uL (ref 145–400)
RBC: 4.18 10*6/uL (ref 3.70–5.45)
WBC: 5.7 10*3/uL (ref 3.9–10.3)

## 2011-02-22 LAB — COMPREHENSIVE METABOLIC PANEL
AST: 18 U/L (ref 0–37)
BUN: 19 mg/dL (ref 6–23)
Calcium: 9.3 mg/dL (ref 8.4–10.5)
Chloride: 102 mEq/L (ref 96–112)
Creatinine, Ser: 0.84 mg/dL (ref 0.50–1.10)
Total Bilirubin: 0.6 mg/dL (ref 0.3–1.2)

## 2011-02-22 LAB — VITAMIN D 25 HYDROXY (VIT D DEFICIENCY, FRACTURES): Vit D, 25-Hydroxy: 74 ng/mL (ref 30–89)

## 2011-03-19 ENCOUNTER — Telehealth: Payer: Self-pay | Admitting: Oncology

## 2011-03-19 ENCOUNTER — Ambulatory Visit (HOSPITAL_BASED_OUTPATIENT_CLINIC_OR_DEPARTMENT_OTHER): Payer: BC Managed Care – PPO | Admitting: Oncology

## 2011-03-19 DIAGNOSIS — E559 Vitamin D deficiency, unspecified: Secondary | ICD-10-CM

## 2011-03-19 DIAGNOSIS — C50919 Malignant neoplasm of unspecified site of unspecified female breast: Secondary | ICD-10-CM

## 2011-03-19 DIAGNOSIS — C50419 Malignant neoplasm of upper-outer quadrant of unspecified female breast: Secondary | ICD-10-CM

## 2011-03-19 NOTE — Telephone Encounter (Signed)
Gv pt appt for may2013 

## 2011-03-19 NOTE — Progress Notes (Signed)
Hematology and Oncology Follow Up Visit  GIAMARIE Collins [id number] 12-22-1950 60 y.o. 03/19/2011 2:14 PM   Principle Diagnosis: 60 year old woman with history of triple-negative breast cancer status post neoadjuvant chemotherapy on the B. 40 protocol, status post lumpectomy 08/12/2006 with pathological CR, status post radiation therapy to right breast completed 10/24/2006. On regular followup  Interim History:  Kelly Collins is doing well. She is to work full-time, occasions are the same. She has no complaints. Most recent mammogram in October was normal. She is due for a followup bone density test next year.  Medications: I have reviewed the patient's current medications.  Allergies: No Known Allergies  Past Medical History, Surgical history, Social history, and Family History were reviewed and updated.  Review of Systems: Constitutional:  Negative for fever, chills, night sweats, anorexia, weight loss, pain. Cardiovascular: no chest pain or dyspnea on exertion Respiratory: no cough, shortness of breath, or wheezing Neurological: no TIA or stroke symptoms Dermatological: negative ENT: negative Skin Gastrointestinal: no abdominal pain, change in bowel habits, or black or bloody stools Genito-Urinary: no dysuria, trouble voiding, or hematuria Hematological and Lymphatic: negative Breast: negative for breast lumps Musculoskeletal: negative Remaining ROS negative.  Physical Exam: Blood pressure 144/85, pulse 111, temperature 98.1 F (36.7 C), temperature source Oral, height 5\' 3"  (1.6 m), weight 189 lb 1.6 oz (85.775 kg). ECOG: 0 General appearance: alert, cooperative and appears stated age Head: Normocephalic, without obvious abnormality, atraumatic Neck: no adenopathy, no carotid bruit, no JVD, supple, symmetrical, trachea midline and thyroid not enlarged, symmetric, no tenderness/mass/nodules Lymph nodes: Cervical, supraclavicular, and axillary nodes normal. Cardiac : Normal normal  heart Pulmonary: Normal breath sounds Breasts: Right and left breasts free of any obvious masses, no nipple retraction or skin changes. Both axilla are negative for adenopathy. Abdomen: No organomegaly or masses  Extremities normal Neuro: Normal  Lab Results: Lab Results  Component Value Date   WBC 5.7 02/21/2011   HGB 13.2 02/21/2011   HCT 38.4 02/21/2011   MCV 91.9 02/21/2011   PLT 234 02/21/2011     Chemistry      Component Value Date/Time   NA 140 02/21/2011 0854   NA 140 02/21/2011 0854   K 3.9 02/21/2011 0854   K 3.9 02/21/2011 0854   CL 102 02/21/2011 0854   CL 102 02/21/2011 0854   CO2 25 02/21/2011 0854   CO2 25 02/21/2011 0854   BUN 19 02/21/2011 0854   BUN 19 02/21/2011 0854   CREATININE 0.84 02/21/2011 0854   CREATININE 0.84 02/21/2011 0854      Component Value Date/Time   CALCIUM 9.3 02/21/2011 0854   CALCIUM 9.3 02/21/2011 0854   ALKPHOS 93 02/21/2011 0854   ALKPHOS 93 02/21/2011 0854   AST 18 02/21/2011 0854   AST 18 02/21/2011 0854   ALT 8 02/21/2011 0854   ALT 8 02/21/2011 0854   BILITOT 0.6 02/21/2011 0854   BILITOT 0.6 02/21/2011 0854       Radiological Studies: chest X-ray n/a Mammogram 10/12-ve Bone density Due in 2013  Impression and Plan: Doing well. No clinical evidence of recurrence. I will see her in 6 months time for followup.  More than 50% of the visit was spent in patient-related counselling   Pierce Crane, MD 11/26/20122:14 PM

## 2011-09-13 ENCOUNTER — Other Ambulatory Visit (HOSPITAL_BASED_OUTPATIENT_CLINIC_OR_DEPARTMENT_OTHER): Payer: PRIVATE HEALTH INSURANCE | Admitting: Lab

## 2011-09-13 DIAGNOSIS — C50919 Malignant neoplasm of unspecified site of unspecified female breast: Secondary | ICD-10-CM

## 2011-09-13 DIAGNOSIS — E559 Vitamin D deficiency, unspecified: Secondary | ICD-10-CM

## 2011-09-13 LAB — CBC WITH DIFFERENTIAL/PLATELET
Basophils Absolute: 0 10*3/uL (ref 0.0–0.1)
EOS%: 2.1 % (ref 0.0–7.0)
Eosinophils Absolute: 0.1 10*3/uL (ref 0.0–0.5)
HCT: 38.6 % (ref 34.8–46.6)
HGB: 13.2 g/dL (ref 11.6–15.9)
LYMPH%: 45.7 % (ref 14.0–49.7)
MCH: 30.7 pg (ref 25.1–34.0)
MCV: 89.8 fL (ref 79.5–101.0)
MONO%: 5.8 % (ref 0.0–14.0)
NEUT#: 2.6 10*3/uL (ref 1.5–6.5)
NEUT%: 45.7 % (ref 38.4–76.8)
Platelets: 235 10*3/uL (ref 145–400)

## 2011-09-14 LAB — COMPREHENSIVE METABOLIC PANEL
AST: 24 U/L (ref 0–37)
Albumin: 4.3 g/dL (ref 3.5–5.2)
Alkaline Phosphatase: 94 U/L (ref 39–117)
BUN: 16 mg/dL (ref 6–23)
Creatinine, Ser: 0.89 mg/dL (ref 0.50–1.10)
Glucose, Bld: 113 mg/dL — ABNORMAL HIGH (ref 70–99)
Potassium: 3.8 mEq/L (ref 3.5–5.3)
Total Bilirubin: 0.8 mg/dL (ref 0.3–1.2)

## 2011-09-14 LAB — VITAMIN D 25 HYDROXY (VIT D DEFICIENCY, FRACTURES): Vit D, 25-Hydroxy: 60 ng/mL (ref 30–89)

## 2011-09-20 ENCOUNTER — Telehealth: Payer: Self-pay | Admitting: *Deleted

## 2011-09-20 ENCOUNTER — Ambulatory Visit (HOSPITAL_BASED_OUTPATIENT_CLINIC_OR_DEPARTMENT_OTHER): Payer: PRIVATE HEALTH INSURANCE | Admitting: Oncology

## 2011-09-20 VITALS — BP 162/80 | HR 102 | Temp 98.0°F | Ht 63.0 in | Wt 188.9 lb

## 2011-09-20 DIAGNOSIS — E559 Vitamin D deficiency, unspecified: Secondary | ICD-10-CM

## 2011-09-20 DIAGNOSIS — C50419 Malignant neoplasm of upper-outer quadrant of unspecified female breast: Secondary | ICD-10-CM

## 2011-09-20 DIAGNOSIS — C50919 Malignant neoplasm of unspecified site of unspecified female breast: Secondary | ICD-10-CM

## 2011-09-20 NOTE — Telephone Encounter (Signed)
left voice message to inform the patient about her 01-2012 with the lab one week before the md appointment

## 2011-09-20 NOTE — Progress Notes (Signed)
Hematology and Oncology Follow Up Visit  RAELA BOHL [id number] May 14, 1950 61 y.o. 09/20/2011 9:44 AM   Principle Diagnosis: 61 year old woman with history of triple-negative breast cancer status post neoadjuvant chemotherapy on the B. 40 protocol, status post lumpectomy 08/12/2006 with pathological CR, status post radiation therapy to right breast completed 10/24/2006. On regular followup  Interim History:  Loany is doing well. She is to work full-time, at [employer]. She has no complaints. Most recent mammogram in October was normal. Her bone density from 1/13 was also normal. She is due for a followup bone density test next year.  Medications: I have reviewed the patient's current medications.  Allergies: No Known Allergies  Past Medical History, Surgical history, Social history, and Family History were reviewed and updated.  Review of Systems: Constitutional:  Negative for fever, chills, night sweats, anorexia, weight loss, pain. Cardiovascular: no chest pain or dyspnea on exertion Respiratory: no cough, shortness of breath, or wheezing Neurological: no TIA or stroke symptoms Dermatological: negative ENT: negative Skin Gastrointestinal: no abdominal pain, change in bowel habits, or black or bloody stools Genito-Urinary: no dysuria, trouble voiding, or hematuria Hematological and Lymphatic: negative Breast: negative for breast lumps Musculoskeletal: negative Remaining ROS negative.  Physical Exam: Blood pressure 162/80, pulse 102, temperature 98 F (36.7 C), height 5\' 3"  (1.6 m), weight 188 lb 14.4 oz (85.684 kg). ECOG: 0 General appearance: alert, cooperative and appears stated age Head: Normocephalic, without obvious abnormality, atraumatic Neck: no adenopathy, no carotid bruit, no JVD, supple, symmetrical, trachea midline and thyroid not enlarged, symmetric, no tenderness/mass/nodules Lymph nodes: Cervical, supraclavicular, and axillary nodes normal. Cardiac :  Normal normal heart Pulmonary: Normal breath sounds Breasts: Right and left breasts free of any obvious masses, no nipple retraction or skin changes. Both axilla are negative for adenopathy. Abdomen: No organomegaly or masses  Extremities normal Neuro: Normal  Lab Results: Lab Results  Component Value Date   WBC 5.7 09/13/2011   HGB 13.2 09/13/2011   HCT 38.6 09/13/2011   MCV 89.8 09/13/2011   PLT 235 09/13/2011     Chemistry      Component Value Date/Time   NA 138 09/13/2011 1329   K 3.8 09/13/2011 1329   CL 102 09/13/2011 1329   CO2 28 09/13/2011 1329   BUN 16 09/13/2011 1329   CREATININE 0.89 09/13/2011 1329      Component Value Date/Time   CALCIUM 9.4 09/13/2011 1329   ALKPHOS 94 09/13/2011 1329   AST 24 09/13/2011 1329   ALT 8 09/13/2011 1329   BILITOT 0.8 09/13/2011 1329       Radiological Studies: chest X-ray n/a Mammogram 10/12-ve Bone density Due in 2013-wnl  Impression and Plan: Doing well. No clinical evidence of recurrence. I will see her in 6 months time for followup.  More than 50% of the visit was spent in patient-related counselling   Pierce Crane, MD 5/30/20139:44 AM

## 2011-11-05 ENCOUNTER — Other Ambulatory Visit: Payer: Self-pay | Admitting: Family Medicine

## 2011-11-05 DIAGNOSIS — E042 Nontoxic multinodular goiter: Secondary | ICD-10-CM

## 2011-11-08 ENCOUNTER — Ambulatory Visit
Admission: RE | Admit: 2011-11-08 | Discharge: 2011-11-08 | Disposition: A | Discharge status: Home / Self Care | Payer: PRIVATE HEALTH INSURANCE | Source: Ambulatory Visit | Attending: Family Medicine | Admitting: Family Medicine

## 2011-11-08 DIAGNOSIS — E042 Nontoxic multinodular goiter: Secondary | ICD-10-CM

## 2011-11-19 ENCOUNTER — Other Ambulatory Visit (HOSPITAL_COMMUNITY)
Admission: RE | Admit: 2011-11-19 | Discharge: 2011-11-19 | Disposition: A | Discharge status: Home / Self Care | Payer: PRIVATE HEALTH INSURANCE | Source: Ambulatory Visit | Attending: Family Medicine | Admitting: Family Medicine

## 2011-11-19 ENCOUNTER — Other Ambulatory Visit: Payer: Self-pay | Admitting: Family Medicine

## 2011-11-19 DIAGNOSIS — Z01419 Encounter for gynecological examination (general) (routine) without abnormal findings: Secondary | ICD-10-CM | POA: Insufficient documentation

## 2012-02-13 ENCOUNTER — Other Ambulatory Visit (HOSPITAL_BASED_OUTPATIENT_CLINIC_OR_DEPARTMENT_OTHER): Payer: PRIVATE HEALTH INSURANCE | Admitting: Lab

## 2012-02-13 DIAGNOSIS — E559 Vitamin D deficiency, unspecified: Secondary | ICD-10-CM

## 2012-02-13 DIAGNOSIS — C50919 Malignant neoplasm of unspecified site of unspecified female breast: Secondary | ICD-10-CM

## 2012-02-13 LAB — CBC WITH DIFFERENTIAL/PLATELET
BASO%: 0.9 % (ref 0.0–2.0)
Basophils Absolute: 0.1 10*3/uL (ref 0.0–0.1)
EOS%: 2.9 % (ref 0.0–7.0)
HCT: 37.8 % (ref 34.8–46.6)
HGB: 13.2 g/dL (ref 11.6–15.9)
LYMPH%: 36.2 % (ref 14.0–49.7)
MCH: 31.9 pg (ref 25.1–34.0)
MCHC: 34.9 g/dL (ref 31.5–36.0)
MCV: 91.4 fL (ref 79.5–101.0)
MONO%: 8.1 % (ref 0.0–14.0)
NEUT%: 51.9 % (ref 38.4–76.8)

## 2012-02-13 LAB — COMPREHENSIVE METABOLIC PANEL (CC13)
AST: 19 U/L (ref 5–34)
Albumin: 3.7 g/dL (ref 3.5–5.0)
Alkaline Phosphatase: 96 U/L (ref 40–150)
BUN: 19 mg/dL (ref 7.0–26.0)
Creatinine: 0.9 mg/dL (ref 0.6–1.1)
Glucose: 100 mg/dl — ABNORMAL HIGH (ref 70–99)
Total Bilirubin: 0.8 mg/dL (ref 0.20–1.20)

## 2012-02-20 ENCOUNTER — Other Ambulatory Visit: Payer: Self-pay | Admitting: Emergency Medicine

## 2012-02-20 ENCOUNTER — Telehealth: Payer: Self-pay | Admitting: Oncology

## 2012-02-20 ENCOUNTER — Ambulatory Visit (HOSPITAL_BASED_OUTPATIENT_CLINIC_OR_DEPARTMENT_OTHER): Payer: PRIVATE HEALTH INSURANCE | Admitting: Oncology

## 2012-02-20 VITALS — BP 144/84 | HR 118 | Temp 97.9°F | Resp 20 | Ht 63.0 in | Wt 188.6 lb

## 2012-02-20 DIAGNOSIS — C50419 Malignant neoplasm of upper-outer quadrant of unspecified female breast: Secondary | ICD-10-CM

## 2012-02-20 DIAGNOSIS — C50919 Malignant neoplasm of unspecified site of unspecified female breast: Secondary | ICD-10-CM

## 2012-02-20 DIAGNOSIS — E559 Vitamin D deficiency, unspecified: Secondary | ICD-10-CM

## 2012-02-20 NOTE — Telephone Encounter (Signed)
gve the pt her April,may 2014 appt calendars. °

## 2012-02-20 NOTE — Progress Notes (Signed)
Hematology and Oncology Follow Up Visit  Kelly Collins [id number] April 06, 1951 61 y.o. 02/20/2012 1:59 PM   Principle Diagnosis: 61 year old woman with history of triple-negative breast cancer status post neoadjuvant chemotherapy on the B. 40 protocol, status post lumpectomy 08/12/2006 with pathological CR, status post radiation therapy to right breast completed 10/24/2006. On regular followup  Interim History:  Kelly Collins is doing well. She is to work full-time, at [employer]. She has no complaints. Most recent imaging including bone density was normal as well as mammogram. She has no other complaints. Appetite good weight is stable. Medications: I have reviewed the patient's current medications.  Allergies: No Known Allergies  Past Medical History, Surgical history, Social history, and Family History were reviewed and updated.  Review of Systems: Constitutional:  Negative for fever, chills, night sweats, anorexia, weight loss, pain. Cardiovascular: no chest pain or dyspnea on exertion Respiratory: no cough, shortness of breath, or wheezing Neurological: no TIA or stroke symptoms Dermatological: negative ENT: negative Skin Gastrointestinal: no abdominal pain, change in bowel habits, or black or bloody stools Genito-Urinary: no dysuria, trouble voiding, or hematuria Hematological and Lymphatic: negative Breast: negative for breast lumps Musculoskeletal: negative Remaining ROS negative.  Physical Exam: Blood pressure 144/84, pulse 118, temperature 97.9 F (36.6 C), resp. rate 20, height 5\' 3"  (1.6 m), weight 188 lb 9.6 oz (85.548 kg). ECOG: 0 General appearance: alert, cooperative and appears stated age Head: Normocephalic, without obvious abnormality, atraumatic Neck: no adenopathy, no carotid bruit, no JVD, supple, symmetrical, trachea midline and thyroid not enlarged, symmetric, no tenderness/mass/nodules Lymph nodes: Cervical, supraclavicular, and axillary nodes  normal. Cardiac : Normal normal heart Pulmonary: Normal breath sounds Breasts: Right and left breasts free of any obvious masses, no nipple retraction or skin changes. Both axilla are negative for adenopathy. Abdomen: No organomegaly or masses  Extremities normal Neuro: Normal  Lab Results: Lab Results  Component Value Date   WBC 5.6 02/13/2012   HGB 13.2 02/13/2012   HCT 37.8 02/13/2012   MCV 91.4 02/13/2012   PLT 225 02/13/2012     Chemistry      Component Value Date/Time   NA 139 02/13/2012 0801   NA 138 09/13/2011 1329   K 4.1 02/13/2012 0801   K 3.8 09/13/2011 1329   CL 104 02/13/2012 0801   CL 102 09/13/2011 1329   CO2 23 02/13/2012 0801   CO2 28 09/13/2011 1329   BUN 19.0 02/13/2012 0801   BUN 16 09/13/2011 1329   CREATININE 0.9 02/13/2012 0801   CREATININE 0.89 09/13/2011 1329      Component Value Date/Time   CALCIUM 10.3 02/13/2012 0801   CALCIUM 9.4 09/13/2011 1329   ALKPHOS 96 02/13/2012 0801   ALKPHOS 94 09/13/2011 1329   AST 19 02/13/2012 0801   AST 24 09/13/2011 1329   ALT 9 02/13/2012 0801   ALT 8 09/13/2011 1329   BILITOT 0.80 02/13/2012 0801   BILITOT 0.8 09/13/2011 1329       Radiological Studies: chest X-ray n/a Mammogram 10/13-nl Bone density   Impression and Plan: Doing well. No clinical evidence of recurrence. I will see her in 6 months time for followup. She is 5 years out from completion of radiation. We'll see her in 6 months and then yearly thereafter.  More than 50% of the visit was spent in patient-related counselling   Pierce Crane, MD 10/30/20131:59 PM

## 2012-07-01 ENCOUNTER — Telehealth: Payer: Self-pay | Admitting: *Deleted

## 2012-07-01 NOTE — Telephone Encounter (Signed)
Received a return phone call from patient to reschedule her appt. Confirmed appt for 08/28/12 at 3pm with [name].  Then will become Dr. Darnelle Catalan.

## 2012-07-01 NOTE — Telephone Encounter (Signed)
Left message for a return phone call to reschedule her appt. Awaiting patient response I will cancel her appts.

## 2012-08-20 ENCOUNTER — Other Ambulatory Visit: Payer: PRIVATE HEALTH INSURANCE | Admitting: Lab

## 2012-08-28 ENCOUNTER — Encounter: Payer: Self-pay | Admitting: Family

## 2012-08-28 ENCOUNTER — Ambulatory Visit (HOSPITAL_BASED_OUTPATIENT_CLINIC_OR_DEPARTMENT_OTHER): Payer: PRIVATE HEALTH INSURANCE | Admitting: Family

## 2012-08-28 ENCOUNTER — Ambulatory Visit: Payer: PRIVATE HEALTH INSURANCE | Admitting: Oncology

## 2012-08-28 VITALS — BP 169/82 | HR 100 | Temp 98.2°F | Resp 20 | Ht 63.0 in | Wt 189.6 lb

## 2012-08-28 DIAGNOSIS — Z853 Personal history of malignant neoplasm of breast: Secondary | ICD-10-CM

## 2012-08-28 DIAGNOSIS — C50919 Malignant neoplasm of unspecified site of unspecified female breast: Secondary | ICD-10-CM | POA: Insufficient documentation

## 2012-08-28 DIAGNOSIS — C50911 Malignant neoplasm of unspecified site of right female breast: Secondary | ICD-10-CM

## 2012-08-28 NOTE — Progress Notes (Signed)
[facility]  Telephone:([phone]) [phone] Fax:([phone]) [phone]  OFFICE PROGRESS NOTE   ID: Kelly Collins   DOB: 03-13-1951  MR#: [id number]  JYN#:[id number]   PCP: Kelly Bradford, MD SU: Kelly Collins, M.D. RAD ONC: Kelly Herb, MD   HISTORY OF PRESENT ILLNESS: Kelly Collins new patient evaluation note dated 01/27/2006: "This is a 62 year old woman seen today for evaluation and treatment of breast cancer referred by  Kelly Collins.  This woman has been in good health all her life.  She has few, if any chronic medical problems.  She has been followed by Kelly Collins.  She has been seeing her every six months for some abnormalities in the left breast.  She has had a previous ultrasound-guided biopsy in September 2003 for benign disease.  She presented for evaluation and for mammogram and ultrasound on January 10, 2006.  Exam on the left showed stable parenchymal pattern.  On the right there is increased density with measurements of 4 cm.  This was poorly marginated.  There was a hyperdense enlarging lymph node in the right axilla that was measured mammographically about 2 cm.  Ultrasound of the breast did show a mass measuring 2.6 x 2.5 x 2.3 cm.  Lymph node in the right axilla measured 1.5 x 1.7 x 0.9 cm.  There was a fullness in the right upper outer quadrant felt clinically.  The patient underwent a biopsy on the same day of both the breast mass, and lymph node, both of which were felt to be consistent with a high-grade invasive ductal cancer.  This was ER and PR negative, proliferative index 19%, HER-2 was 2+.  FISH is pending.  Patient has since undergone an MRI scan on January 22, 2006.  This showed the abnormal enhancing mass at 10 o'clock position measuring 3.3 x 2.4 x 2.4 cm.  Right axillary lymphadenopathy is also appreciated.  No abnormalities were seen in the left breast.  Patient denies having felt a mass in her breasts.  She denies any skin change or nipple retraction."   Her subsequent history is as detailed below.  INTERVAL HISTORY: Kelly Collins and I saw Kelly Collins today for follow up of invasive ductal carcinoma of the right breast.  The patient was last seen by Kelly Collins on 02/20/2012.  Since her last office visit, the patient has been doing relatively well.  The patient is a participant in NSABP-B 40 research study.  She is establishing herself with Kelly Collins service today.    REVIEW OF SYSTEMS: A 10 point review of systems was completed and is negative except occasional constipation.  The patient denies any other symptomatology.   PAST MEDICAL HISTORY: Past Medical History  Diagnosis Date  . Breast cancer 01/10/2006    Right breat  . Allergy     Seasonal  . Hypertension   . Hyperlipidemia   . Vitamin D deficiency     PAST SURGICAL HISTORY: Past Surgical History  Procedure Laterality Date  . Tonsillectomy    . Breast lumpectomy w/ needle localization Right 08/12/2006  . Biopsy breast Left 2003    FAMILY HISTORY Family History  Problem Relation Age of Onset  . Cancer Mother 69    Breast cancer  .     Marland Kitchen Cancer Paternal Grandmother 41    Breast cancer  . Cancer Cousin 35    Maternal cousin with breast cancer  Includes breast cancer in her mother, died at age 98.  She has a maternal cousin  with breast cancer at age 14 and paternal grandmother with breast cancer in the age of 36s.   GYNECOLOGIC HISTORY: She is gravida 0, para 0.  Menarche at age 48.  Postmenopausal since 1998.  She was on hormone replacement therapy for four to five years using Prempro, stopped in 2003.  SOCIAL HISTORY: Kelly Collins (who prefers to be called Kelly Collins instead of Kelly Collins), is single, she does not have any kids, and has never been married..  She grew up in [place], [place] and moved to [place], [place] and moved back to [place] in the year 2000.  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  She has one sister and a nephew living in town.  In her spare time she enjoys reading, going to the beach, traveling, and spending time with family and friends.   ADVANCED DIRECTIVES:  Not on file  HEALTH MAINTENANCE: History  Substance Use Topics  . Smoking status: Never Smoker   . Smokeless tobacco: Never Used  . Alcohol Use: Yes     Comment: Occasionally    Colonoscopy:  Not on file PAP:  11/19/2011 Bone density:  The patient's last bone density scan on 05/08/2011 showed a T score of -0.6 (normal). Lipid panel:  Not on file  No Known Allergies  Current Outpatient Prescriptions  Medication Sig Dispense Refill  . cetirizine (ZYRTEC) 10 MG tablet Take 10 mg by mouth daily as needed.        . cholecalciferol (VITAMIN D) 1000 UNITS tablet Take 1,000 Units by mouth daily.        . fluticasone (FLONASE) 50 MCG/ACT nasal spray Place 2 sprays into the nose daily.       . metoprolol succinate (TOPROL-XL) 25 MG 24 hr tablet Take 12.5 mg by mouth daily. Take 12.5mg  daily      . Olopatadine HCl (PATADAY) 0.2 % SOLN Apply 2 drops to eye as needed.        . pravastatin (PRAVACHOL) 40 MG tablet Take 40 mg by mouth daily.       No current facility-administered medications for this visit.    OBJECTIVE: Filed Vitals:   08/28/12 1447  BP: 169/82  Pulse: 100  Temp: 98.2 F (36.8 C)  Resp: 20     Body mass index is 33.59 kg/(m^2).      ECOG FS:   Grade 1 - Symptomatic, but fully ambulatory                       General appearance: Alert, cooperative, well nourished, no apparent distress Head: Normocephalic, without obvious abnormality, atraumatic Eyes: Conjunctivae/corneas clear, PERRLA, EOMI Nose: Nares, septum and mucosa are normal, no drainage or sinus tenderness Neck: No adenopathy, supple, symmetrical, trachea midline, thyroid not enlarged, no tenderness Resp: Clear to auscultation  bilaterally Cardio: Regular rate and rhythm, S1, S2 normal, no murmur, click, rub or gallop Breasts: Pendulous bilaterally,right breast and axillary area have well-healed surgical scars, bilateral superior medial mammary ridge, right upper extremity mild lymphedema, no nipple inversion, no axilla fullness GI: Soft, distended, non-tender, hypoactive bowel sounds, no organomegaly Extremities: Extremities normal, atraumatic, no cyanosis or edema Lymph nodes: Cervical, supraclavicular, and axillary nodes normal Neurologic: Grossly normal   LAB RESULTS: Lab Results  Component Value Date   WBC 5.6 02/13/2012   NEUTROABS 2.9 02/13/2012   HGB 13.2 02/13/2012  HCT 37.8 02/13/2012   MCV 91.4 02/13/2012   PLT 225 02/13/2012      Chemistry      Component Value Date/Time   NA 139 02/13/2012 0801   NA 138 09/13/2011 1329   K 4.1 02/13/2012 0801   K 3.8 09/13/2011 1329   CL 104 02/13/2012 0801   CL 102 09/13/2011 1329   CO2 23 02/13/2012 0801   CO2 28 09/13/2011 1329   BUN 19.0 02/13/2012 0801   BUN 16 09/13/2011 1329   CREATININE 0.9 02/13/2012 0801   CREATININE 0.89 09/13/2011 1329      Component Value Date/Time   CALCIUM 10.3 02/13/2012 0801   CALCIUM 9.4 09/13/2011 1329   ALKPHOS 96 02/13/2012 0801   ALKPHOS 94 09/13/2011 1329   AST 19 02/13/2012 0801   AST 24 09/13/2011 1329   ALT 9 02/13/2012 0801   ALT 8 09/13/2011 1329   BILITOT 0.80 02/13/2012 0801   BILITOT 0.8 09/13/2011 1329       Lab Results  Component Value Date   LABCA2 10 02/21/2011    Urinalysis No results found for this basename: colorurine,  appearanceur,  labspec,  phurine,  glucoseu,  hgbur,  bilirubinur,  ketonesur,  proteinur,  urobilinogen,  nitrite,  leukocytesur    STUDIES: No results found.  ASSESSMENT: 62 y.o. [place]: 1.  Status post right breast needle core biopsy of the upper outer quadrant which showed invasive ductal carcinoma and right axillary lymph node biopsy which  showed metastatic carcinoma grade 3 on 01/10/2006, ER 0%, PR 0%, HER2/neu 2+ with no amplification by FISH and a ratio of 1.2.  2.  The patient had a 2-D echocardiogram on 01/17/2006 which showed an ejection fraction and 55% to 65%.  3.  The patient had a bilateral breast MRI on 01/20/2006 which showed a solitary irregular enhancing mass of the right breast at the 10 o'clock location with spiculated margins, measuring overall 3.3 x 2.4 x 2.4 cm.  Several enlarged right axillary lymph nodes identified, the largest of which measured 2.0 x 1.1 cm.  No abnormal enhancement was seen in the left breast.  Stage IIB, T2 N1.  4.  The patient became a participant in research study NSABP- B 40 on 01/29/2006.  5.  Status post neoadjuvant chemotherapy with Taxotere x 4 cycles from 02/07/2006 through 04/11/2006 with Neulasta support.  This was followed by neoadjuvant chemotherapy with AC times 3 cycles from 05/02/2006 through 07/04/2006 with Neulasta support.  The patient did not receive Xeloda or Bevacizumab.  5.  The patient underwent a right breast wire/needle localized lumpectomy with right excisional biopsy of additional margins and right regional axillary resection on 08/12/2006 which showed no residual carcinoma/a  pathological complete response, ypTx (R0) ypN0, year 0%, peer 0%, Ki-67 19%, HER2/neu 2+ with no amplification by FISH with a ratio of 1.2.  6.  The patient underwent radiation therapy from 09/09/2006 through 10/24/2006.  7.  The patient had genetic counseling and testing with a genetics letter dated 07/22/2007 stating the patient tested negative for the BRCA1 and BRCA2 gene mutations.  PLAN: Ms. Ohagan is nearing 7 years from her time of diagnosis and will officially become a graduate of [facility]'s breast cancer program today. A graduation ceremony was held for her.  We asked that she continue annual clinical breast examinations by a physician in addition to annual mammography.  Her last mammogram  on 02/13/2012 showed stable architectural distortion at the lumpectomy site on the right,  and no new or worrisome findings were seen in either breast.  She is due for her annual mammogram in 01/2013.   She will be due for another bone density scan in 04/2013.  All questions were answered.  The patient was encouraged to contact us with any problems, questions or concerns.    Larina Bras, NP-C 08/30/2012, 11:28 PM

## 2012-08-28 NOTE — Patient Instructions (Addendum)
Please contact us at [phone] if you have any questions or concerns.  Please continue to do well and enjoy life!!!  Get plenty of rest, drink plenty of water, exercise daily, eat a balanced diet.  Take Caltrate 600 mg daily and Vitamin D3 1000 IUs daily.    Clinical breast exam by a physician at least once yearly.  Continue mammograms once a year.  Have a bone density scan every 2 years (due again 04/2013)  . Health Maintenance, Females  A healthy lifestyle and preventative care can promote health and wellness.  Maintain regular health, dental, and eye exams.  Eat a healthy diet. Foods like vegetables, fruits, whole grains, low-fat dairy products, and lean protein foods contain the nutrients you need without too many calories. Decrease your intake of foods high in solid fats, added sugars, and salt. Get information about a proper diet from your caregiver, if necessary.  Regular physical exercise is one of the most important things you can do for your health. Most adults should get at least 150 minutes of moderate-intensity exercise (any activity that increases your heart rate and causes you to sweat) each week. In addition, most adults need muscle-strengthening exercises on 2 or more days a week.  Maintain a healthy weight. The body mass index (BMI) is a screening tool to identify possible weight problems. It provides an estimate of body fat based on height and weight. Your caregiver can help determine your BMI, and can help you achieve or maintain a healthy weight. For adults 20 years and older:  A BMI below 18.5 is considered underweight.  A BMI of 18.5 to 24.9 is normal.  A BMI of 25 to 29.9 is considered overweight.  A BMI of 30 and above is considered obese.  Maintain normal blood lipids and cholesterol by exercising and minimizing your intake of saturated fat. Eat a balanced diet with plenty of fruits and vegetables. Blood tests for lipids and cholesterol should begin at age 36  and be repeated every 5 years. If your lipid or cholesterol levels are high, you are over 50, or you are a high risk for heart disease, you may need your cholesterol levels checked more frequently. Ongoing high lipid and cholesterol levels should be treated with medicines if diet and exercise are not effective.  If you smoke, find out from your caregiver how to quit. If you do not use tobacco, do not start.  If you are pregnant, do not drink alcohol. If you are breastfeeding, be very cautious about drinking alcohol. If you are not pregnant and choose to drink alcohol, do not exceed 1 drink per day. One drink is considered to be 12 ounces (355 mL) of beer, 5 ounces (148 mL) of wine, or 1.5 ounces (44 mL) of liquor.  Avoid use of street drugs. Do not share needles with anyone. Ask for help if you need support or instructions about stopping the use of drugs.  High blood pressure causes heart disease and increases the risk of stroke. Blood pressure should be checked at least every 1 to 2 years. Ongoing high blood pressure should be treated with medicines, if weight loss and exercise are not effective.  If you are 55 to 64 years old, ask your caregiver if you should take aspirin to prevent strokes.  Diabetes screening involves taking a blood sample to check your fasting blood sugar level. This should be done once every 3 years, after age 27, if you are within normal weight and without  risk factors for diabetes. Testing should be considered at a younger age or be carried out more frequently if you are overweight and have at least 1 risk factor for diabetes.  Breast cancer screening is essential preventative care for women. You should practice "breast self-awareness." This means understanding the normal appearance and feel of your breasts and may include breast self-examination. Any changes detected, no matter how small, should be reported to a caregiver. Women in their 67s and 30s should have a clinical breast exam  (CBE) by a caregiver as part of a regular health exam every 1 to 3 years. After age 59, women should have a CBE every year. Starting at age 66, women should consider having a mammogram (breast X-ray) every year. Women who have a family history of breast cancer should talk to their caregiver about genetic screening. Women at a high risk of breast cancer should talk to their caregiver about having an MRI and a mammogram every year.  The Pap test is a screening test for cervical cancer. Women should have a Pap test starting at age 46. Between ages 59 and 84, Pap tests should be repeated every 2 years. Beginning at age 81, you should have a Pap test every 3 years as long as the past 3 Pap tests have been normal. If you had a hysterectomy for a problem that was not cancer or a condition that could lead to cancer, then you no longer need Pap tests. If you are between ages 83 and 48, and you have had normal Pap tests going back 10 years, you no longer need Pap tests. If you have had past treatment for cervical cancer or a condition that could lead to cancer, you need Pap tests and screening for cancer for at least 20 years after your treatment. If Pap tests have been discontinued, risk factors (such as a new sexual partner) need to be reassessed to determine if screening should be resumed. Some women have medical problems that increase the chance of getting cervical cancer. In these cases, your caregiver may recommend more frequent screening and Pap tests.  The human papillomavirus (HPV) test is an additional test that may be used for cervical cancer screening. The HPV test looks for the virus that can cause the cell changes on the cervix. The cells collected during the Pap test can be tested for HPV. The HPV test could be used to screen women aged 9 years and older, and should be used in women of any age who have unclear Pap test results. After the age of 17, women should have HPV testing at the same frequency as a Pap  test.  Colorectal cancer can be detected and often prevented. Most routine colorectal cancer screening begins at the age of 80 and continues through age 3. However, your caregiver may recommend screening at an earlier age if you have risk factors for colon cancer. On a yearly basis, your caregiver may provide home test kits to check for hidden blood in the stool. Use of a small camera at the end of a tube, to directly examine the colon (sigmoidoscopy or colonoscopy), can detect the earliest forms of colorectal cancer. Talk to your caregiver about this at age 19, when routine screening begins. Direct examination of the colon should be repeated every 5 to 10 years through age 60, unless early forms of pre-cancerous polyps or small growths are found.  Hepatitis C blood testing is recommended for all people born from [birth year] through 1967 and  any individual with known risks for hepatitis C.  Practice safe sex. Use condoms and avoid high-risk sexual practices to reduce the spread of sexually transmitted infections (STIs). Sexually active women aged 70 and younger should be checked for Chlamydia, which is a common sexually transmitted infection. Older women with new or multiple partners should also be tested for Chlamydia. Testing for other STIs is recommended if you are sexually active and at increased risk.  Osteoporosis is a disease in which the bones lose minerals and strength with aging. This can result in serious bone fractures. The risk of osteoporosis can be identified using a bone density scan. Women ages 59 and over and women at risk for fractures or osteoporosis should discuss screening with their caregivers. Ask your caregiver whether you should be taking a calcium supplement or vitamin D to reduce the rate of osteoporosis.  Menopause can be associated with physical symptoms and risks. Hormone replacement therapy is available to decrease symptoms and risks. You should talk to your caregiver about whether  hormone replacement therapy is right for you.  Use sunscreen with a sun protection factor (SPF) of 30 or greater. Apply sunscreen liberally and repeatedly throughout the day. You should seek shade when your shadow is shorter than you. Protect yourself by wearing long sleeves, pants, a wide-brimmed hat, and sunglasses year round, whenever you are outdoors.  Notify your caregiver of new moles or changes in moles, especially if there is a change in shape or color. Also notify your caregiver if a mole is larger than the size of a pencil eraser.  Stay current with your immunizations.  Document Released: 10/23/2010 Document Revised: 07/02/2011 Document Reviewed: 10/23/2010 [facility] Patient Information 2013 [place], [place].

## 2012-08-30 ENCOUNTER — Encounter: Payer: Self-pay | Admitting: Family

## 2012-10-23 ENCOUNTER — Other Ambulatory Visit: Payer: Self-pay | Admitting: Family Medicine

## 2012-10-23 DIAGNOSIS — E041 Nontoxic single thyroid nodule: Secondary | ICD-10-CM

## 2012-10-28 ENCOUNTER — Ambulatory Visit
Admission: RE | Admit: 2012-10-28 | Discharge: 2012-10-28 | Disposition: A | Discharge status: Home / Self Care | Payer: PRIVATE HEALTH INSURANCE | Source: Ambulatory Visit | Attending: Family Medicine | Admitting: Family Medicine

## 2012-10-28 DIAGNOSIS — E041 Nontoxic single thyroid nodule: Secondary | ICD-10-CM

## 2014-02-26 ENCOUNTER — Telehealth: Payer: Self-pay | Admitting: Oncology

## 2014-02-26 NOTE — Telephone Encounter (Signed)
02/26/14 - Spoke to patient on phone.  She states she is doing fine.  No problems.  She had her mammogram done last week. Everything is fine with that.   I will call and check on her again next year.

## 2014-10-27 ENCOUNTER — Other Ambulatory Visit: Payer: Self-pay | Admitting: Family Medicine

## 2014-10-27 DIAGNOSIS — E041 Nontoxic single thyroid nodule: Secondary | ICD-10-CM

## 2014-10-29 ENCOUNTER — Ambulatory Visit
Admission: RE | Admit: 2014-10-29 | Discharge: 2014-10-29 | Disposition: A | Discharge status: Home / Self Care | Payer: PRIVATE HEALTH INSURANCE | Source: Ambulatory Visit | Attending: Family Medicine | Admitting: Family Medicine

## 2014-10-29 DIAGNOSIS — E041 Nontoxic single thyroid nodule: Secondary | ICD-10-CM

## 2015-01-11 ENCOUNTER — Other Ambulatory Visit: Payer: Self-pay | Admitting: Family Medicine

## 2015-01-11 ENCOUNTER — Other Ambulatory Visit (HOSPITAL_COMMUNITY)
Admission: RE | Admit: 2015-01-11 | Discharge: 2015-01-11 | Disposition: A | Discharge status: Home / Self Care | Payer: PRIVATE HEALTH INSURANCE | Source: Ambulatory Visit | Attending: Family Medicine | Admitting: Family Medicine

## 2015-01-11 DIAGNOSIS — Z124 Encounter for screening for malignant neoplasm of cervix: Secondary | ICD-10-CM | POA: Insufficient documentation

## 2015-01-11 DIAGNOSIS — Z1151 Encounter for screening for human papillomavirus (HPV): Secondary | ICD-10-CM | POA: Insufficient documentation

## 2015-01-12 LAB — CYTOLOGY - PAP

## 2016-03-08 ENCOUNTER — Telehealth: Payer: Self-pay | Admitting: Oncology

## 2016-03-08 NOTE — Telephone Encounter (Signed)
03/08/16 - Called and spoke to patient on the phone regarding follow-up to the B-40 study.  Patient states she is doing good.  No problems.  Just had her mammogram and everything was fine.   Remer Macho 03/08/16 - 4:18 pm

## 2016-07-09 DIAGNOSIS — E6609 Other obesity due to excess calories: Secondary | ICD-10-CM | POA: Diagnosis not present

## 2016-07-09 DIAGNOSIS — E785 Hyperlipidemia, unspecified: Secondary | ICD-10-CM | POA: Diagnosis not present

## 2016-07-09 DIAGNOSIS — I10 Essential (primary) hypertension: Secondary | ICD-10-CM | POA: Diagnosis not present

## 2016-11-28 DIAGNOSIS — H10413 Chronic giant papillary conjunctivitis, bilateral: Secondary | ICD-10-CM | POA: Diagnosis not present

## 2016-11-28 DIAGNOSIS — H2513 Age-related nuclear cataract, bilateral: Secondary | ICD-10-CM | POA: Diagnosis not present

## 2017-02-18 DIAGNOSIS — Z23 Encounter for immunization: Secondary | ICD-10-CM | POA: Diagnosis not present

## 2017-02-18 DIAGNOSIS — I10 Essential (primary) hypertension: Secondary | ICD-10-CM | POA: Diagnosis not present

## 2017-02-18 DIAGNOSIS — E785 Hyperlipidemia, unspecified: Secondary | ICD-10-CM | POA: Diagnosis not present

## 2017-02-18 DIAGNOSIS — Z0000 Encounter for general adult medical examination without abnormal findings: Secondary | ICD-10-CM | POA: Diagnosis not present

## 2017-02-18 DIAGNOSIS — E559 Vitamin D deficiency, unspecified: Secondary | ICD-10-CM | POA: Diagnosis not present

## 2017-02-20 DIAGNOSIS — Z08 Encounter for follow-up examination after completed treatment for malignant neoplasm: Secondary | ICD-10-CM | POA: Diagnosis not present

## 2017-02-20 DIAGNOSIS — L570 Actinic keratosis: Secondary | ICD-10-CM | POA: Diagnosis not present

## 2017-02-20 DIAGNOSIS — Z1283 Encounter for screening for malignant neoplasm of skin: Secondary | ICD-10-CM | POA: Diagnosis not present

## 2017-02-20 DIAGNOSIS — L820 Inflamed seborrheic keratosis: Secondary | ICD-10-CM | POA: Diagnosis not present

## 2017-02-20 DIAGNOSIS — X32XXXD Exposure to sunlight, subsequent encounter: Secondary | ICD-10-CM | POA: Diagnosis not present

## 2017-02-20 DIAGNOSIS — Z85828 Personal history of other malignant neoplasm of skin: Secondary | ICD-10-CM | POA: Diagnosis not present

## 2017-03-05 DIAGNOSIS — R922 Inconclusive mammogram: Secondary | ICD-10-CM | POA: Diagnosis not present

## 2017-03-05 DIAGNOSIS — Z853 Personal history of malignant neoplasm of breast: Secondary | ICD-10-CM | POA: Diagnosis not present

## 2017-08-26 DIAGNOSIS — E785 Hyperlipidemia, unspecified: Secondary | ICD-10-CM | POA: Diagnosis not present

## 2017-08-26 DIAGNOSIS — I10 Essential (primary) hypertension: Secondary | ICD-10-CM | POA: Diagnosis not present

## 2017-08-28 DIAGNOSIS — Z08 Encounter for follow-up examination after completed treatment for malignant neoplasm: Secondary | ICD-10-CM | POA: Diagnosis not present

## 2017-08-28 DIAGNOSIS — L820 Inflamed seborrheic keratosis: Secondary | ICD-10-CM | POA: Diagnosis not present

## 2017-08-28 DIAGNOSIS — Z85828 Personal history of other malignant neoplasm of skin: Secondary | ICD-10-CM | POA: Diagnosis not present

## 2018-01-02 DIAGNOSIS — H10413 Chronic giant papillary conjunctivitis, bilateral: Secondary | ICD-10-CM | POA: Diagnosis not present

## 2018-01-02 DIAGNOSIS — H2513 Age-related nuclear cataract, bilateral: Secondary | ICD-10-CM | POA: Diagnosis not present

## 2018-01-02 DIAGNOSIS — H04123 Dry eye syndrome of bilateral lacrimal glands: Secondary | ICD-10-CM | POA: Diagnosis not present

## 2018-01-02 DIAGNOSIS — H11041 Peripheral pterygium, stationary, right eye: Secondary | ICD-10-CM | POA: Diagnosis not present

## 2018-02-19 DIAGNOSIS — E559 Vitamin D deficiency, unspecified: Secondary | ICD-10-CM | POA: Diagnosis not present

## 2018-02-19 DIAGNOSIS — E2839 Other primary ovarian failure: Secondary | ICD-10-CM | POA: Diagnosis not present

## 2018-02-19 DIAGNOSIS — I10 Essential (primary) hypertension: Secondary | ICD-10-CM | POA: Diagnosis not present

## 2018-02-19 DIAGNOSIS — E785 Hyperlipidemia, unspecified: Secondary | ICD-10-CM | POA: Diagnosis not present

## 2018-02-19 DIAGNOSIS — Z0000 Encounter for general adult medical examination without abnormal findings: Secondary | ICD-10-CM | POA: Diagnosis not present

## 2018-02-19 DIAGNOSIS — Z1211 Encounter for screening for malignant neoplasm of colon: Secondary | ICD-10-CM | POA: Diagnosis not present

## 2018-02-19 DIAGNOSIS — E663 Overweight: Secondary | ICD-10-CM | POA: Diagnosis not present

## 2018-02-19 DIAGNOSIS — J309 Allergic rhinitis, unspecified: Secondary | ICD-10-CM | POA: Diagnosis not present

## 2018-03-07 DIAGNOSIS — Z1231 Encounter for screening mammogram for malignant neoplasm of breast: Secondary | ICD-10-CM | POA: Diagnosis not present

## 2018-03-07 DIAGNOSIS — E349 Endocrine disorder, unspecified: Secondary | ICD-10-CM | POA: Diagnosis not present

## 2018-03-07 DIAGNOSIS — Z780 Asymptomatic menopausal state: Secondary | ICD-10-CM | POA: Diagnosis not present

## 2018-03-07 DIAGNOSIS — Z853 Personal history of malignant neoplasm of breast: Secondary | ICD-10-CM | POA: Diagnosis not present

## 2018-03-10 DIAGNOSIS — X32XXXD Exposure to sunlight, subsequent encounter: Secondary | ICD-10-CM | POA: Diagnosis not present

## 2018-03-10 DIAGNOSIS — L570 Actinic keratosis: Secondary | ICD-10-CM | POA: Diagnosis not present

## 2018-03-10 DIAGNOSIS — Z08 Encounter for follow-up examination after completed treatment for malignant neoplasm: Secondary | ICD-10-CM | POA: Diagnosis not present

## 2018-03-10 DIAGNOSIS — L820 Inflamed seborrheic keratosis: Secondary | ICD-10-CM | POA: Diagnosis not present

## 2018-03-10 DIAGNOSIS — Z85828 Personal history of other malignant neoplasm of skin: Secondary | ICD-10-CM | POA: Diagnosis not present

## 2018-06-10 DIAGNOSIS — Z1211 Encounter for screening for malignant neoplasm of colon: Secondary | ICD-10-CM | POA: Diagnosis not present

## 2018-06-10 DIAGNOSIS — Z8371 Family history of colonic polyps: Secondary | ICD-10-CM | POA: Diagnosis not present

## 2018-08-22 DIAGNOSIS — E785 Hyperlipidemia, unspecified: Secondary | ICD-10-CM | POA: Diagnosis not present

## 2018-08-22 DIAGNOSIS — I10 Essential (primary) hypertension: Secondary | ICD-10-CM | POA: Diagnosis not present

## 2018-08-22 DIAGNOSIS — F5101 Primary insomnia: Secondary | ICD-10-CM | POA: Diagnosis not present

## 2018-09-01 DIAGNOSIS — L820 Inflamed seborrheic keratosis: Secondary | ICD-10-CM | POA: Diagnosis not present

## 2018-09-01 DIAGNOSIS — Z85828 Personal history of other malignant neoplasm of skin: Secondary | ICD-10-CM | POA: Diagnosis not present

## 2018-09-01 DIAGNOSIS — Z08 Encounter for follow-up examination after completed treatment for malignant neoplasm: Secondary | ICD-10-CM | POA: Diagnosis not present

## 2019-01-07 DIAGNOSIS — H43812 Vitreous degeneration, left eye: Secondary | ICD-10-CM | POA: Diagnosis not present

## 2019-01-07 DIAGNOSIS — H04123 Dry eye syndrome of bilateral lacrimal glands: Secondary | ICD-10-CM | POA: Diagnosis not present

## 2019-01-07 DIAGNOSIS — H10413 Chronic giant papillary conjunctivitis, bilateral: Secondary | ICD-10-CM | POA: Diagnosis not present

## 2019-01-07 DIAGNOSIS — H2513 Age-related nuclear cataract, bilateral: Secondary | ICD-10-CM | POA: Diagnosis not present

## 2019-02-09 DIAGNOSIS — E559 Vitamin D deficiency, unspecified: Secondary | ICD-10-CM | POA: Diagnosis not present

## 2019-02-09 DIAGNOSIS — I10 Essential (primary) hypertension: Secondary | ICD-10-CM | POA: Diagnosis not present

## 2019-02-09 DIAGNOSIS — E785 Hyperlipidemia, unspecified: Secondary | ICD-10-CM | POA: Diagnosis not present

## 2019-02-24 DIAGNOSIS — I10 Essential (primary) hypertension: Secondary | ICD-10-CM | POA: Diagnosis not present

## 2019-02-24 DIAGNOSIS — E785 Hyperlipidemia, unspecified: Secondary | ICD-10-CM | POA: Diagnosis not present

## 2019-02-24 DIAGNOSIS — E559 Vitamin D deficiency, unspecified: Secondary | ICD-10-CM | POA: Diagnosis not present

## 2019-03-02 DIAGNOSIS — D225 Melanocytic nevi of trunk: Secondary | ICD-10-CM | POA: Diagnosis not present

## 2019-03-02 DIAGNOSIS — Z08 Encounter for follow-up examination after completed treatment for malignant neoplasm: Secondary | ICD-10-CM | POA: Diagnosis not present

## 2019-03-02 DIAGNOSIS — L570 Actinic keratosis: Secondary | ICD-10-CM | POA: Diagnosis not present

## 2019-03-02 DIAGNOSIS — X32XXXD Exposure to sunlight, subsequent encounter: Secondary | ICD-10-CM | POA: Diagnosis not present

## 2019-03-02 DIAGNOSIS — Z85828 Personal history of other malignant neoplasm of skin: Secondary | ICD-10-CM | POA: Diagnosis not present

## 2019-03-02 DIAGNOSIS — Z1283 Encounter for screening for malignant neoplasm of skin: Secondary | ICD-10-CM | POA: Diagnosis not present

## 2019-03-23 DIAGNOSIS — Z1231 Encounter for screening mammogram for malignant neoplasm of breast: Secondary | ICD-10-CM | POA: Diagnosis not present

## 2019-03-23 DIAGNOSIS — Z853 Personal history of malignant neoplasm of breast: Secondary | ICD-10-CM | POA: Diagnosis not present

## 2019-03-27 DIAGNOSIS — Z853 Personal history of malignant neoplasm of breast: Secondary | ICD-10-CM | POA: Diagnosis not present

## 2019-03-27 DIAGNOSIS — N6311 Unspecified lump in the right breast, upper outer quadrant: Secondary | ICD-10-CM | POA: Diagnosis not present

## 2019-04-09 ENCOUNTER — Other Ambulatory Visit: Payer: Self-pay

## 2019-04-09 DIAGNOSIS — N6031 Fibrosclerosis of right breast: Secondary | ICD-10-CM | POA: Diagnosis not present

## 2019-04-09 DIAGNOSIS — N6311 Unspecified lump in the right breast, upper outer quadrant: Secondary | ICD-10-CM | POA: Diagnosis not present

## 2019-05-06 DIAGNOSIS — E559 Vitamin D deficiency, unspecified: Secondary | ICD-10-CM | POA: Diagnosis not present

## 2019-05-06 DIAGNOSIS — Z0000 Encounter for general adult medical examination without abnormal findings: Secondary | ICD-10-CM | POA: Diagnosis not present

## 2019-05-06 DIAGNOSIS — E785 Hyperlipidemia, unspecified: Secondary | ICD-10-CM | POA: Diagnosis not present

## 2019-05-06 DIAGNOSIS — I10 Essential (primary) hypertension: Secondary | ICD-10-CM | POA: Diagnosis not present

## 2019-05-22 ENCOUNTER — Ambulatory Visit: Payer: PRIVATE HEALTH INSURANCE

## 2019-05-28 ENCOUNTER — Ambulatory Visit: Payer: PRIVATE HEALTH INSURANCE | Attending: Internal Medicine

## 2019-05-28 DIAGNOSIS — Z23 Encounter for immunization: Secondary | ICD-10-CM | POA: Insufficient documentation

## 2019-05-28 NOTE — Progress Notes (Signed)
   Covid-19 Vaccination Clinic  Name:  CLAUDEEN ARMBRECHT    MRN: [id number] DOB: [birth year]  05/28/2019  Ms. Rodes was observed post Covid-19 immunization for 15 minutes without incidence. She was provided with Vaccine Information Sheet and instruction to access the V-Safe system.   Ms. Holstad was instructed to call 911 with any severe reactions post vaccine: Marland Kitchen Difficulty breathing  . Swelling of your face and throat  . A fast heartbeat  . A bad rash all over your body  . Dizziness and weakness    Immunizations Administered    Name Date Dose VIS Date Route   Pfizer COVID-19 Vaccine 05/28/2019  8:58 AM 0.3 mL 04/03/2019 Intramuscular   Manufacturer: [place]   Lot: [id number]   [place]: [id number]

## 2019-06-08 ENCOUNTER — Ambulatory Visit: Payer: PRIVATE HEALTH INSURANCE

## 2019-06-22 ENCOUNTER — Ambulatory Visit: Payer: PRIVATE HEALTH INSURANCE | Attending: Internal Medicine

## 2019-06-22 DIAGNOSIS — Z23 Encounter for immunization: Secondary | ICD-10-CM | POA: Insufficient documentation

## 2019-06-22 NOTE — Progress Notes (Signed)
   Covid-19 Vaccination Clinic  Name:  Kelly Collins    MRN: [id number] DOB: [birth year]  06/22/2019  [name] was observed post Covid-19 immunization for 15 minutes without incidence. She was provided with Vaccine Information Sheet and instruction to access the V-Safe system.   [name] was instructed to call 911 with any severe reactions post vaccine: Marland Kitchen Difficulty breathing  . Swelling of your face and throat  . A fast heartbeat  . A bad rash all over your body  . Dizziness and weakness    Immunizations Administered    Name Date Dose VIS Date Route   Pfizer COVID-19 Vaccine 06/22/2019  1:36 PM 0.3 mL 04/03/2019 Intramuscular   Manufacturer: [place]   Lot: [id number]   [place]: [id number]

## 2019-10-06 DIAGNOSIS — R922 Inconclusive mammogram: Secondary | ICD-10-CM | POA: Diagnosis not present

## 2019-11-04 DIAGNOSIS — I10 Essential (primary) hypertension: Secondary | ICD-10-CM | POA: Diagnosis not present

## 2019-11-04 DIAGNOSIS — E785 Hyperlipidemia, unspecified: Secondary | ICD-10-CM | POA: Diagnosis not present

## 2020-02-01 DIAGNOSIS — H43391 Other vitreous opacities, right eye: Secondary | ICD-10-CM | POA: Diagnosis not present

## 2020-02-01 DIAGNOSIS — H10413 Chronic giant papillary conjunctivitis, bilateral: Secondary | ICD-10-CM | POA: Diagnosis not present

## 2020-02-01 DIAGNOSIS — H2513 Age-related nuclear cataract, bilateral: Secondary | ICD-10-CM | POA: Diagnosis not present

## 2020-02-01 DIAGNOSIS — H43812 Vitreous degeneration, left eye: Secondary | ICD-10-CM | POA: Diagnosis not present

## 2020-03-31 DIAGNOSIS — Z1231 Encounter for screening mammogram for malignant neoplasm of breast: Secondary | ICD-10-CM | POA: Diagnosis not present

## 2020-05-11 DIAGNOSIS — Z0000 Encounter for general adult medical examination without abnormal findings: Secondary | ICD-10-CM | POA: Diagnosis not present

## 2020-05-11 DIAGNOSIS — Z1159 Encounter for screening for other viral diseases: Secondary | ICD-10-CM | POA: Diagnosis not present

## 2020-05-11 DIAGNOSIS — E559 Vitamin D deficiency, unspecified: Secondary | ICD-10-CM | POA: Diagnosis not present

## 2020-05-11 DIAGNOSIS — I10 Essential (primary) hypertension: Secondary | ICD-10-CM | POA: Diagnosis not present

## 2020-05-11 DIAGNOSIS — E785 Hyperlipidemia, unspecified: Secondary | ICD-10-CM | POA: Diagnosis not present

## 2020-05-11 DIAGNOSIS — M25552 Pain in left hip: Secondary | ICD-10-CM | POA: Diagnosis not present

## 2020-05-11 DIAGNOSIS — Z1389 Encounter for screening for other disorder: Secondary | ICD-10-CM | POA: Diagnosis not present

## 2020-07-13 DIAGNOSIS — L820 Inflamed seborrheic keratosis: Secondary | ICD-10-CM | POA: Diagnosis not present

## 2020-07-13 DIAGNOSIS — X32XXXD Exposure to sunlight, subsequent encounter: Secondary | ICD-10-CM | POA: Diagnosis not present

## 2020-07-13 DIAGNOSIS — L570 Actinic keratosis: Secondary | ICD-10-CM | POA: Diagnosis not present

## 2020-07-13 DIAGNOSIS — D0462 Carcinoma in situ of skin of left upper limb, including shoulder: Secondary | ICD-10-CM | POA: Diagnosis not present

## 2020-07-13 DIAGNOSIS — Z08 Encounter for follow-up examination after completed treatment for malignant neoplasm: Secondary | ICD-10-CM | POA: Diagnosis not present

## 2020-07-13 DIAGNOSIS — Z85828 Personal history of other malignant neoplasm of skin: Secondary | ICD-10-CM | POA: Diagnosis not present

## 2020-07-13 DIAGNOSIS — D225 Melanocytic nevi of trunk: Secondary | ICD-10-CM | POA: Diagnosis not present

## 2020-07-13 DIAGNOSIS — Z1283 Encounter for screening for malignant neoplasm of skin: Secondary | ICD-10-CM | POA: Diagnosis not present

## 2021-02-16 DIAGNOSIS — H2513 Age-related nuclear cataract, bilateral: Secondary | ICD-10-CM | POA: Diagnosis not present

## 2021-02-16 DIAGNOSIS — H43391 Other vitreous opacities, right eye: Secondary | ICD-10-CM | POA: Diagnosis not present

## 2021-02-16 DIAGNOSIS — H43812 Vitreous degeneration, left eye: Secondary | ICD-10-CM | POA: Diagnosis not present

## 2021-02-16 DIAGNOSIS — H10413 Chronic giant papillary conjunctivitis, bilateral: Secondary | ICD-10-CM | POA: Diagnosis not present

## 2021-04-06 DIAGNOSIS — Z1231 Encounter for screening mammogram for malignant neoplasm of breast: Secondary | ICD-10-CM | POA: Diagnosis not present

## 2021-05-23 DIAGNOSIS — M25552 Pain in left hip: Secondary | ICD-10-CM | POA: Diagnosis not present

## 2021-05-23 DIAGNOSIS — I10 Essential (primary) hypertension: Secondary | ICD-10-CM | POA: Diagnosis not present

## 2021-05-23 DIAGNOSIS — Z0000 Encounter for general adult medical examination without abnormal findings: Secondary | ICD-10-CM | POA: Diagnosis not present

## 2021-05-23 DIAGNOSIS — R198 Other specified symptoms and signs involving the digestive system and abdomen: Secondary | ICD-10-CM | POA: Diagnosis not present

## 2021-05-23 DIAGNOSIS — M2041 Other hammer toe(s) (acquired), right foot: Secondary | ICD-10-CM | POA: Diagnosis not present

## 2021-05-23 DIAGNOSIS — M2042 Other hammer toe(s) (acquired), left foot: Secondary | ICD-10-CM | POA: Diagnosis not present

## 2021-05-23 DIAGNOSIS — E785 Hyperlipidemia, unspecified: Secondary | ICD-10-CM | POA: Diagnosis not present

## 2021-05-24 ENCOUNTER — Other Ambulatory Visit: Payer: Self-pay | Admitting: Family Medicine

## 2021-05-24 ENCOUNTER — Other Ambulatory Visit: Payer: Self-pay

## 2021-05-24 ENCOUNTER — Ambulatory Visit
Admission: RE | Admit: 2021-05-24 | Discharge: 2021-05-24 | Disposition: A | Discharge status: Home / Self Care | Payer: HMO | Source: Ambulatory Visit | Attending: Family Medicine | Admitting: Family Medicine

## 2021-05-24 DIAGNOSIS — M25552 Pain in left hip: Secondary | ICD-10-CM

## 2021-05-31 DIAGNOSIS — R198 Other specified symptoms and signs involving the digestive system and abdomen: Secondary | ICD-10-CM | POA: Diagnosis not present

## 2021-07-05 DIAGNOSIS — R194 Change in bowel habit: Secondary | ICD-10-CM | POA: Diagnosis not present

## 2021-07-05 DIAGNOSIS — R195 Other fecal abnormalities: Secondary | ICD-10-CM | POA: Diagnosis not present

## 2021-07-05 DIAGNOSIS — K909 Intestinal malabsorption, unspecified: Secondary | ICD-10-CM | POA: Diagnosis not present

## 2021-07-06 DIAGNOSIS — M25552 Pain in left hip: Secondary | ICD-10-CM | POA: Diagnosis not present

## 2021-07-06 DIAGNOSIS — M1612 Unilateral primary osteoarthritis, left hip: Secondary | ICD-10-CM | POA: Diagnosis not present

## 2021-07-12 DIAGNOSIS — R194 Change in bowel habit: Secondary | ICD-10-CM | POA: Diagnosis not present

## 2021-07-26 DIAGNOSIS — M25552 Pain in left hip: Secondary | ICD-10-CM | POA: Diagnosis not present

## 2021-08-14 DIAGNOSIS — K8689 Other specified diseases of pancreas: Secondary | ICD-10-CM | POA: Diagnosis not present

## 2021-08-15 DIAGNOSIS — L570 Actinic keratosis: Secondary | ICD-10-CM | POA: Diagnosis not present

## 2021-08-15 DIAGNOSIS — Z85828 Personal history of other malignant neoplasm of skin: Secondary | ICD-10-CM | POA: Diagnosis not present

## 2021-08-15 DIAGNOSIS — Z08 Encounter for follow-up examination after completed treatment for malignant neoplasm: Secondary | ICD-10-CM | POA: Diagnosis not present

## 2021-08-15 DIAGNOSIS — X32XXXD Exposure to sunlight, subsequent encounter: Secondary | ICD-10-CM | POA: Diagnosis not present

## 2021-09-19 DIAGNOSIS — D123 Benign neoplasm of transverse colon: Secondary | ICD-10-CM | POA: Diagnosis not present

## 2021-09-19 DIAGNOSIS — R195 Other fecal abnormalities: Secondary | ICD-10-CM | POA: Diagnosis not present

## 2021-09-19 DIAGNOSIS — D122 Benign neoplasm of ascending colon: Secondary | ICD-10-CM | POA: Diagnosis not present

## 2021-09-19 DIAGNOSIS — D121 Benign neoplasm of appendix: Secondary | ICD-10-CM | POA: Diagnosis not present

## 2021-09-19 DIAGNOSIS — K5730 Diverticulosis of large intestine without perforation or abscess without bleeding: Secondary | ICD-10-CM | POA: Diagnosis not present

## 2021-09-26 DIAGNOSIS — D123 Benign neoplasm of transverse colon: Secondary | ICD-10-CM | POA: Diagnosis not present

## 2021-10-17 DIAGNOSIS — K8689 Other specified diseases of pancreas: Secondary | ICD-10-CM | POA: Diagnosis not present

## 2022-01-03 DIAGNOSIS — M25552 Pain in left hip: Secondary | ICD-10-CM | POA: Diagnosis not present

## 2022-02-13 DIAGNOSIS — Z1283 Encounter for screening for malignant neoplasm of skin: Secondary | ICD-10-CM | POA: Diagnosis not present

## 2022-02-13 DIAGNOSIS — L820 Inflamed seborrheic keratosis: Secondary | ICD-10-CM | POA: Diagnosis not present

## 2022-02-13 DIAGNOSIS — X32XXXD Exposure to sunlight, subsequent encounter: Secondary | ICD-10-CM | POA: Diagnosis not present

## 2022-02-13 DIAGNOSIS — L570 Actinic keratosis: Secondary | ICD-10-CM | POA: Diagnosis not present

## 2022-02-13 DIAGNOSIS — D225 Melanocytic nevi of trunk: Secondary | ICD-10-CM | POA: Diagnosis not present

## 2022-04-06 DIAGNOSIS — H2513 Age-related nuclear cataract, bilateral: Secondary | ICD-10-CM | POA: Diagnosis not present

## 2022-04-06 DIAGNOSIS — H3589 Other specified retinal disorders: Secondary | ICD-10-CM | POA: Diagnosis not present

## 2022-04-06 DIAGNOSIS — H10413 Chronic giant papillary conjunctivitis, bilateral: Secondary | ICD-10-CM | POA: Diagnosis not present

## 2022-04-06 DIAGNOSIS — H43812 Vitreous degeneration, left eye: Secondary | ICD-10-CM | POA: Diagnosis not present

## 2022-04-12 DIAGNOSIS — Z780 Asymptomatic menopausal state: Secondary | ICD-10-CM | POA: Diagnosis not present

## 2022-04-12 DIAGNOSIS — Z1231 Encounter for screening mammogram for malignant neoplasm of breast: Secondary | ICD-10-CM | POA: Diagnosis not present

## 2022-05-16 DIAGNOSIS — K8689 Other specified diseases of pancreas: Secondary | ICD-10-CM | POA: Diagnosis not present

## 2022-06-12 DIAGNOSIS — I10 Essential (primary) hypertension: Secondary | ICD-10-CM | POA: Diagnosis not present

## 2022-06-12 DIAGNOSIS — E559 Vitamin D deficiency, unspecified: Secondary | ICD-10-CM | POA: Diagnosis not present

## 2022-06-12 DIAGNOSIS — M79652 Pain in left thigh: Secondary | ICD-10-CM | POA: Diagnosis not present

## 2022-06-12 DIAGNOSIS — J309 Allergic rhinitis, unspecified: Secondary | ICD-10-CM | POA: Diagnosis not present

## 2022-06-12 DIAGNOSIS — M79651 Pain in right thigh: Secondary | ICD-10-CM | POA: Diagnosis not present

## 2022-06-12 DIAGNOSIS — E669 Obesity, unspecified: Secondary | ICD-10-CM | POA: Diagnosis not present

## 2022-06-12 DIAGNOSIS — E785 Hyperlipidemia, unspecified: Secondary | ICD-10-CM | POA: Diagnosis not present

## 2022-06-12 DIAGNOSIS — Z0000 Encounter for general adult medical examination without abnormal findings: Secondary | ICD-10-CM | POA: Diagnosis not present

## 2022-08-07 DIAGNOSIS — L570 Actinic keratosis: Secondary | ICD-10-CM | POA: Diagnosis not present

## 2022-08-07 DIAGNOSIS — X32XXXD Exposure to sunlight, subsequent encounter: Secondary | ICD-10-CM | POA: Diagnosis not present

## 2022-12-19 DIAGNOSIS — M25552 Pain in left hip: Secondary | ICD-10-CM | POA: Diagnosis not present

## 2023-02-05 DIAGNOSIS — L820 Inflamed seborrheic keratosis: Secondary | ICD-10-CM | POA: Diagnosis not present

## 2023-02-05 DIAGNOSIS — X32XXXD Exposure to sunlight, subsequent encounter: Secondary | ICD-10-CM | POA: Diagnosis not present

## 2023-02-05 DIAGNOSIS — L570 Actinic keratosis: Secondary | ICD-10-CM | POA: Diagnosis not present

## 2023-02-05 DIAGNOSIS — D225 Melanocytic nevi of trunk: Secondary | ICD-10-CM | POA: Diagnosis not present

## 2023-02-05 DIAGNOSIS — Z1283 Encounter for screening for malignant neoplasm of skin: Secondary | ICD-10-CM | POA: Diagnosis not present

## 2023-04-18 DIAGNOSIS — Z1231 Encounter for screening mammogram for malignant neoplasm of breast: Secondary | ICD-10-CM | POA: Diagnosis not present

## 2023-06-14 DIAGNOSIS — H2513 Age-related nuclear cataract, bilateral: Secondary | ICD-10-CM | POA: Diagnosis not present

## 2023-06-14 DIAGNOSIS — H0288A Meibomian gland dysfunction right eye, upper and lower eyelids: Secondary | ICD-10-CM | POA: Diagnosis not present

## 2023-06-14 DIAGNOSIS — H43813 Vitreous degeneration, bilateral: Secondary | ICD-10-CM | POA: Diagnosis not present

## 2023-06-14 DIAGNOSIS — H0288B Meibomian gland dysfunction left eye, upper and lower eyelids: Secondary | ICD-10-CM | POA: Diagnosis not present

## 2023-06-20 DIAGNOSIS — H16223 Keratoconjunctivitis sicca, not specified as Sjogren's, bilateral: Secondary | ICD-10-CM | POA: Diagnosis not present

## 2023-06-20 DIAGNOSIS — H0288A Meibomian gland dysfunction right eye, upper and lower eyelids: Secondary | ICD-10-CM | POA: Diagnosis not present

## 2023-06-20 DIAGNOSIS — H04543 Stenosis of bilateral lacrimal canaliculi: Secondary | ICD-10-CM | POA: Diagnosis not present

## 2023-06-20 DIAGNOSIS — H04223 Epiphora due to insufficient drainage, bilateral lacrimal glands: Secondary | ICD-10-CM | POA: Diagnosis not present

## 2023-06-20 DIAGNOSIS — H43813 Vitreous degeneration, bilateral: Secondary | ICD-10-CM | POA: Diagnosis not present

## 2023-06-20 DIAGNOSIS — H0288B Meibomian gland dysfunction left eye, upper and lower eyelids: Secondary | ICD-10-CM | POA: Diagnosis not present

## 2023-06-20 DIAGNOSIS — H2513 Age-related nuclear cataract, bilateral: Secondary | ICD-10-CM | POA: Diagnosis not present

## 2023-08-06 DIAGNOSIS — L570 Actinic keratosis: Secondary | ICD-10-CM | POA: Diagnosis not present

## 2023-08-06 DIAGNOSIS — L820 Inflamed seborrheic keratosis: Secondary | ICD-10-CM | POA: Diagnosis not present

## 2023-08-06 DIAGNOSIS — X32XXXD Exposure to sunlight, subsequent encounter: Secondary | ICD-10-CM | POA: Diagnosis not present

## 2023-08-14 DIAGNOSIS — M1612 Unilateral primary osteoarthritis, left hip: Secondary | ICD-10-CM | POA: Diagnosis not present

## 2023-09-23 DIAGNOSIS — M25452 Effusion, left hip: Secondary | ICD-10-CM | POA: Diagnosis not present

## 2023-09-23 DIAGNOSIS — M25762 Osteophyte, left knee: Secondary | ICD-10-CM | POA: Diagnosis not present

## 2023-09-23 DIAGNOSIS — M1612 Unilateral primary osteoarthritis, left hip: Secondary | ICD-10-CM | POA: Diagnosis not present

## 2023-09-23 DIAGNOSIS — M24852 Other specific joint derangements of left hip, not elsewhere classified: Secondary | ICD-10-CM | POA: Diagnosis not present

## 2023-10-21 DIAGNOSIS — E785 Hyperlipidemia, unspecified: Secondary | ICD-10-CM | POA: Diagnosis not present

## 2023-10-21 DIAGNOSIS — E669 Obesity, unspecified: Secondary | ICD-10-CM | POA: Diagnosis not present

## 2023-10-21 DIAGNOSIS — I10 Essential (primary) hypertension: Secondary | ICD-10-CM | POA: Diagnosis not present

## 2023-10-21 DIAGNOSIS — M1612 Unilateral primary osteoarthritis, left hip: Secondary | ICD-10-CM | POA: Diagnosis not present

## 2023-10-22 DIAGNOSIS — I10 Essential (primary) hypertension: Secondary | ICD-10-CM | POA: Diagnosis not present

## 2023-11-04 DIAGNOSIS — Z5189 Encounter for other specified aftercare: Secondary | ICD-10-CM | POA: Diagnosis not present

## 2023-11-20 DIAGNOSIS — I10 Essential (primary) hypertension: Secondary | ICD-10-CM | POA: Diagnosis not present

## 2023-11-21 DIAGNOSIS — E669 Obesity, unspecified: Secondary | ICD-10-CM | POA: Diagnosis not present

## 2023-11-21 DIAGNOSIS — M1612 Unilateral primary osteoarthritis, left hip: Secondary | ICD-10-CM | POA: Diagnosis not present

## 2023-11-21 DIAGNOSIS — I10 Essential (primary) hypertension: Secondary | ICD-10-CM | POA: Diagnosis not present

## 2023-11-21 DIAGNOSIS — E785 Hyperlipidemia, unspecified: Secondary | ICD-10-CM | POA: Diagnosis not present

## 2023-12-16 DIAGNOSIS — Z471 Aftercare following joint replacement surgery: Secondary | ICD-10-CM | POA: Diagnosis not present

## 2023-12-16 DIAGNOSIS — Z96642 Presence of left artificial hip joint: Secondary | ICD-10-CM | POA: Diagnosis not present

## 2023-12-22 DIAGNOSIS — E785 Hyperlipidemia, unspecified: Secondary | ICD-10-CM | POA: Diagnosis not present

## 2023-12-22 DIAGNOSIS — M1612 Unilateral primary osteoarthritis, left hip: Secondary | ICD-10-CM | POA: Diagnosis not present

## 2023-12-22 DIAGNOSIS — I10 Essential (primary) hypertension: Secondary | ICD-10-CM | POA: Diagnosis not present

## 2023-12-22 DIAGNOSIS — E669 Obesity, unspecified: Secondary | ICD-10-CM | POA: Diagnosis not present

## 2024-02-04 DIAGNOSIS — Z1283 Encounter for screening for malignant neoplasm of skin: Secondary | ICD-10-CM | POA: Diagnosis not present

## 2024-02-04 DIAGNOSIS — D225 Melanocytic nevi of trunk: Secondary | ICD-10-CM | POA: Diagnosis not present

## 2024-02-04 DIAGNOSIS — X32XXXD Exposure to sunlight, subsequent encounter: Secondary | ICD-10-CM | POA: Diagnosis not present

## 2024-02-04 DIAGNOSIS — L820 Inflamed seborrheic keratosis: Secondary | ICD-10-CM | POA: Diagnosis not present

## 2024-02-04 DIAGNOSIS — L570 Actinic keratosis: Secondary | ICD-10-CM | POA: Diagnosis not present

## 2024-02-21 DIAGNOSIS — M1612 Unilateral primary osteoarthritis, left hip: Secondary | ICD-10-CM | POA: Diagnosis not present

## 2024-02-21 DIAGNOSIS — E785 Hyperlipidemia, unspecified: Secondary | ICD-10-CM | POA: Diagnosis not present

## 2024-02-21 DIAGNOSIS — E669 Obesity, unspecified: Secondary | ICD-10-CM | POA: Diagnosis not present

## 2024-02-21 DIAGNOSIS — I10 Essential (primary) hypertension: Secondary | ICD-10-CM | POA: Diagnosis not present
